# Patient Record
Sex: Male | Born: 1960 | Race: White | Hispanic: No | Marital: Single | Smoking: Former smoker
Health system: Southern US, Community
[De-identification: ages and names within clinical notes are randomized; demographics above are authoritative.]

## PROBLEM LIST (undated history)

## (undated) DIAGNOSIS — N200 Calculus of kidney: Secondary | ICD-10-CM

## (undated) DIAGNOSIS — Z87891 Personal history of nicotine dependence: Secondary | ICD-10-CM

## (undated) DIAGNOSIS — K219 Gastro-esophageal reflux disease without esophagitis: Secondary | ICD-10-CM

## (undated) DIAGNOSIS — N400 Enlarged prostate without lower urinary tract symptoms: Secondary | ICD-10-CM

## (undated) DIAGNOSIS — I5020 Unspecified systolic (congestive) heart failure: Secondary | ICD-10-CM

## (undated) DIAGNOSIS — R9431 Abnormal electrocardiogram [ECG] [EKG]: Secondary | ICD-10-CM

## (undated) DIAGNOSIS — Z87442 Personal history of urinary calculi: Secondary | ICD-10-CM

## (undated) DIAGNOSIS — I38 Endocarditis, valve unspecified: Secondary | ICD-10-CM

## (undated) DIAGNOSIS — I10 Essential (primary) hypertension: Secondary | ICD-10-CM

## (undated) DIAGNOSIS — F1290 Cannabis use, unspecified, uncomplicated: Secondary | ICD-10-CM

## (undated) DIAGNOSIS — I2510 Atherosclerotic heart disease of native coronary artery without angina pectoris: Secondary | ICD-10-CM

## (undated) DIAGNOSIS — I77810 Thoracic aortic ectasia: Secondary | ICD-10-CM

## (undated) DIAGNOSIS — A159 Respiratory tuberculosis unspecified: Secondary | ICD-10-CM

## (undated) DIAGNOSIS — E785 Hyperlipidemia, unspecified: Secondary | ICD-10-CM

## (undated) DIAGNOSIS — T8859XA Other complications of anesthesia, initial encounter: Secondary | ICD-10-CM

## (undated) DIAGNOSIS — K4090 Unilateral inguinal hernia, without obstruction or gangrene, not specified as recurrent: Secondary | ICD-10-CM

## (undated) HISTORY — DX: Unilateral inguinal hernia, without obstruction or gangrene, not specified as recurrent: K40.90

## (undated) HISTORY — DX: Endocarditis, valve unspecified: I38

## (undated) HISTORY — DX: Calculus of kidney: N20.0

## (undated) HISTORY — DX: Thoracic aortic ectasia: I77.810

## (undated) HISTORY — DX: Abnormal electrocardiogram (ECG) (EKG): R94.31

## (undated) HISTORY — DX: Unspecified systolic (congestive) heart failure: I50.20

## (undated) HISTORY — DX: Personal history of nicotine dependence: Z87.891

---

## 1985-07-18 HISTORY — PX: INGUINAL HERNIA REPAIR: SUR1180

## 2008-12-02 ENCOUNTER — Emergency Department: Payer: Self-pay | Admitting: Unknown Physician Specialty

## 2010-07-03 ENCOUNTER — Emergency Department (HOSPITAL_COMMUNITY)
Admission: EM | Admit: 2010-07-03 | Discharge: 2010-07-03 | Payer: Self-pay | Source: Home / Self Care | Admitting: Emergency Medicine

## 2010-11-09 ENCOUNTER — Encounter: Payer: Self-pay | Admitting: Family Medicine

## 2010-11-09 ENCOUNTER — Ambulatory Visit (INDEPENDENT_AMBULATORY_CARE_PROVIDER_SITE_OTHER): Payer: BC Managed Care – PPO | Admitting: Family Medicine

## 2010-11-09 VITALS — BP 138/80 | HR 80 | Temp 97.9°F | Ht 71.75 in | Wt 187.1 lb

## 2010-11-09 DIAGNOSIS — D179 Benign lipomatous neoplasm, unspecified: Secondary | ICD-10-CM | POA: Insufficient documentation

## 2010-11-09 DIAGNOSIS — F17200 Nicotine dependence, unspecified, uncomplicated: Secondary | ICD-10-CM

## 2010-11-09 DIAGNOSIS — M79609 Pain in unspecified limb: Secondary | ICD-10-CM

## 2010-11-09 DIAGNOSIS — Z720 Tobacco use: Secondary | ICD-10-CM

## 2010-11-09 DIAGNOSIS — Z0001 Encounter for general adult medical examination with abnormal findings: Secondary | ICD-10-CM | POA: Insufficient documentation

## 2010-11-09 DIAGNOSIS — N508 Other specified disorders of male genital organs: Secondary | ICD-10-CM

## 2010-11-09 DIAGNOSIS — M79601 Pain in right arm: Secondary | ICD-10-CM

## 2010-11-09 DIAGNOSIS — K4090 Unilateral inguinal hernia, without obstruction or gangrene, not specified as recurrent: Secondary | ICD-10-CM | POA: Insufficient documentation

## 2010-11-09 DIAGNOSIS — N448 Other noninflammatory disorders of the testis: Secondary | ICD-10-CM

## 2010-11-09 DIAGNOSIS — Z0000 Encounter for general adult medical examination without abnormal findings: Secondary | ICD-10-CM | POA: Insufficient documentation

## 2010-11-09 DIAGNOSIS — Z87891 Personal history of nicotine dependence: Secondary | ICD-10-CM | POA: Insufficient documentation

## 2010-11-09 MED ORDER — NAPROXEN 500 MG PO TABS: 500.0000 mg | ORAL_TABLET | Freq: Two times a day (BID) | ORAL | Status: DC

## 2010-11-09 NOTE — Progress Notes (Signed)
Subjective:    Patient ID: Caleb Spencer, male    DOB: 10/03/1960, 50 y.o.   MRN: [id number]  HPI CC: new patient  Never had doctor before.  1. Several month history of fingers and arm tingling.  Thumb and index finger. Wakes him up in middle of night.  Feels ache in elbow joint as well that wakes him up.  Has tried ibuprofen which didn't help.  No shoulder or neck pain.  Worse with extension of arm.  2. Smoking - 3-4 cigarettes/day with beer at night.  Wants to quit.  3. Also wants lump on shoulder checked.  There for 1-2 years.  Not painful.  6lb weight loss in 2 months.  Tends to lose weight in summer, put back on in winter.  No NS, f/c.  Preventative: Unsure last tetanus (10 years ago), requests today. No recent blood work.  Medications and allergies reviewed and updated as above. PMHx, SurgHx, SHx and FMHx reviewed for relevance and updated in chart.  Review of Systems  Constitutional: Negative for fever, chills, activity change, appetite change, fatigue and unexpected weight change.  HENT: Negative for hearing loss and neck pain.   Eyes: Negative for visual disturbance.  Respiratory: Positive for cough (smoker's cough) and shortness of breath. Negative for chest tightness and wheezing.   Cardiovascular: Negative for chest pain, palpitations and leg swelling.  Gastrointestinal: Negative for nausea, vomiting, abdominal pain, diarrhea, constipation, blood in stool and abdominal distention.  Genitourinary: Positive for scrotal swelling. Negative for hematuria and difficulty urinating.  Musculoskeletal: Negative for myalgias and arthralgias.  Skin: Negative for rash.  Neurological: Negative for dizziness, seizures, syncope and headaches.  Hematological: Does not bruise/bleed easily.  Psychiatric/Behavioral: Negative for dysphoric mood. The patient is not nervous/anxious.        Objective:   Physical Exam  [nursing notereviewed. Constitutional: He is oriented to person,  place, and time. He appears well-developed and well-nourished.  HENT:  Head: Normocephalic and atraumatic.  Right Ear: External ear normal.  Left Ear: External ear normal.  Nose: Nose normal.  Mouth/Throat: Oropharynx is clear and moist. No oropharyngeal exudate.  Eyes: Conjunctivae and EOM are normal. Pupils are equal, round, and reactive to light. No scleral icterus.  Neck: Normal range of motion. Neck supple. No thyromegaly present.  Cardiovascular: Normal rate, regular rhythm, normal heart sounds and intact distal pulses.   No murmur heard. Pulses:      Radial pulses are 2+ on the right side, and 2+ on the left side.  Pulmonary/Chest: Effort normal and breath sounds normal. No respiratory distress. He has no wheezes. He has no rales.  Abdominal: Soft. Bowel sounds are normal. He exhibits no distension and no mass. There is no tenderness. There is no rebound and no guarding. A hernia is present. Hernia confirmed positive in the right inguinal area (large hernia into scrotal sack, reduces with supine). Hernia confirmed negative in the left inguinal area.  Genitourinary: Penis normal. Right testis shows swelling (no hardening of testes, but there is some hypertrophy on right). Right testis shows no tenderness. Left testis shows no mass and no tenderness.  Musculoskeletal: Normal range of motion. He exhibits no edema.       Right elbow: He exhibits normal range of motion, no swelling and no effusion. tenderness found. Lateral epicondyle tenderness noted. No radial head, no medial epicondyle and no olecranon process tenderness noted.       Left elbow: Normal.       Neg phalen, tinel's.  Endorses numbness palmar 1st/2nd R digits, none on palm or wrist.  Grip strength intact bilaterally  Lymphadenopathy:    He has no cervical adenopathy.       Right: No inguinal adenopathy present.       Left: No inguinal adenopathy present.  Neurological: He is alert and oriented to person, place, and time.        CN grossly intact, station and gait intact  Skin: Skin is warm and dry. No rash noted.     Psychiatric: He has a normal mood and affect. His behavior is normal. Judgment and thought content normal.          Assessment & Plan:

## 2010-11-09 NOTE — Assessment & Plan Note (Signed)
Longstanding large R inguinal hernia, will need repair.  Referral to surgery.  No pain currently, no incarceration. S/p [facility] repair [phone]

## 2010-11-09 NOTE — Patient Instructions (Addendum)
For hernia - we will refer you to surgery for evaluation. Schedule scrotal ultrasound. Pass by Marion's office to set both of these up. Return at your convenience in next few months to schedule physical (fasting a few days prior for blood work). Try stretching exercises on handout for tennis elbow. Continue anti inflammatory - sent script to your pharmacy - take with food.  Stop ibuprofen (same family).

## 2010-11-09 NOTE — Assessment & Plan Note (Signed)
Reassured.  Not bothering him currently

## 2010-11-09 NOTE — Assessment & Plan Note (Signed)
Due for tetanus, blood work.  return when fasting for CPE.

## 2010-11-09 NOTE — Assessment & Plan Note (Signed)
L testicle WNL. Enlarged R testicle, nontender, not hardened per se.  Hernia reduces when supine, testis still with some hypertrophy.  ? Just from chronic pressure of hernia on testicle.  Will check scrotal US to r/o other cause.

## 2010-11-09 NOTE — Assessment & Plan Note (Signed)
Encouraged cessation - did not have time to discuss further

## 2010-11-09 NOTE — Progress Notes (Signed)
Addended by: Eustaquio Boyden on: 11/09/2010 12:44 PM   Modules accepted: Orders

## 2010-11-09 NOTE — Assessment & Plan Note (Signed)
Tender at lateral epicondyle, treat as tennis elbow with stretching exercises (handout from [facility] patient advisor) as well as NSAIDs.   Return if not improved.  Considered CTS however phalen's and tinels negative.  If not improved, consider referral to hand for eval.

## 2010-11-11 ENCOUNTER — Ambulatory Visit
Admission: RE | Admit: 2010-11-11 | Discharge: 2010-11-11 | Disposition: A | Discharge status: Home / Self Care | Payer: BC Managed Care – PPO | Source: Ambulatory Visit | Attending: Family Medicine | Admitting: Family Medicine

## 2010-11-11 DIAGNOSIS — N448 Other noninflammatory disorders of the testis: Secondary | ICD-10-CM

## 2010-12-19 ENCOUNTER — Other Ambulatory Visit: Payer: Self-pay | Admitting: Family Medicine

## 2010-12-19 DIAGNOSIS — Z13220 Encounter for screening for lipoid disorders: Secondary | ICD-10-CM

## 2010-12-19 DIAGNOSIS — Z0000 Encounter for general adult medical examination without abnormal findings: Secondary | ICD-10-CM

## 2010-12-20 ENCOUNTER — Other Ambulatory Visit: Payer: BC Managed Care – PPO

## 2010-12-28 ENCOUNTER — Encounter: Payer: BC Managed Care – PPO | Admitting: Family Medicine

## 2011-01-04 ENCOUNTER — Encounter: Payer: BC Managed Care – PPO | Admitting: Family Medicine

## 2011-02-07 ENCOUNTER — Other Ambulatory Visit (INDEPENDENT_AMBULATORY_CARE_PROVIDER_SITE_OTHER): Payer: BC Managed Care – PPO | Admitting: Family Medicine

## 2011-02-07 DIAGNOSIS — Z13220 Encounter for screening for lipoid disorders: Secondary | ICD-10-CM

## 2011-02-07 DIAGNOSIS — Z0000 Encounter for general adult medical examination without abnormal findings: Secondary | ICD-10-CM

## 2011-02-07 LAB — COMPREHENSIVE METABOLIC PANEL
ALT: 17 U/L (ref 0–53)
Albumin: 4.3 g/dL (ref 3.5–5.2)
CO2: 28 mEq/L (ref 19–32)
Calcium: 8.7 mg/dL (ref 8.4–10.5)
Chloride: 103 mEq/L (ref 96–112)
GFR: 76.28 mL/min (ref >60.00)
Potassium: 4.8 mEq/L (ref 3.5–5.1)
Sodium: 138 mEq/L (ref 135–145)
Total Protein: 7.9 g/dL (ref 6.0–8.3)

## 2011-02-07 LAB — CBC WITH DIFFERENTIAL/PLATELET
Basophils Absolute: 0 10*3/uL (ref 0.0–0.1)
Eosinophils Absolute: 0.1 10*3/uL (ref 0.0–0.7)
Lymphocytes Relative: 25.4 % (ref 12.0–46.0)
MCHC: 33.9 g/dL (ref 30.0–36.0)
MCV: 98.9 fl (ref 78.0–100.0)
Monocytes Absolute: 0.9 10*3/uL (ref 0.1–1.0)
Neutrophils Relative %: 63.7 % (ref 43.0–77.0)
RDW: 13.4 % (ref 11.5–14.6)

## 2011-02-07 LAB — LIPID PANEL
Cholesterol: 208 mg/dL — ABNORMAL HIGH (ref 0–200)
Total CHOL/HDL Ratio: 4

## 2011-02-10 ENCOUNTER — Ambulatory Visit (INDEPENDENT_AMBULATORY_CARE_PROVIDER_SITE_OTHER): Payer: BC Managed Care – PPO | Admitting: Family Medicine

## 2011-02-10 ENCOUNTER — Encounter: Payer: Self-pay | Admitting: Family Medicine

## 2011-02-10 VITALS — BP 138/80 | HR 68 | Temp 98.6°F | Wt 186.8 lb

## 2011-02-10 DIAGNOSIS — Z720 Tobacco use: Secondary | ICD-10-CM

## 2011-02-10 DIAGNOSIS — F17200 Nicotine dependence, unspecified, uncomplicated: Secondary | ICD-10-CM

## 2011-02-10 DIAGNOSIS — K4090 Unilateral inguinal hernia, without obstruction or gangrene, not specified as recurrent: Secondary | ICD-10-CM

## 2011-02-10 DIAGNOSIS — Z0000 Encounter for general adult medical examination without abnormal findings: Secondary | ICD-10-CM

## 2011-02-10 DIAGNOSIS — Z23 Encounter for immunization: Secondary | ICD-10-CM

## 2011-02-10 NOTE — Progress Notes (Signed)
Subjective:    Patient ID: Caleb Spencer, male    DOB: 1961-03-30, 50 y.o.   MRN: [id number]  HPI CC: CPE  Arm tingling - continued, trouble sleeping at night 2/2 this.  Ibuprofen/naprosyn didn't help.  Naprosyn caused gi upset.  OJ upsets stomach, h/o reflux in past.  Works outside, drinks plenty of water to stay hydrated.  Elevated sugar - did drink 7-up prior to visit.  Attributes to this.  EtOH - drinks a forty daily.  Cigarettes - down to 1 cig/day.  Wants to quit cold [place]  Activity - stays active at work. Diet - endorses healthy, plenty of fruits and vegetables  Preventative: Unsure last tetanus (10 years ago). Requests today. reviewed blood work. Not due for colon or prostate screening.  No bowel changes, no blood in stool, voiding fine.  Medications and allergies reviewed and updated in chart. Patient Active Problem List  Diagnoses  . Tobacco abuse  . Right inguinal hernia  . Hypertrophy, testis  . Lipoma  . Healthcare maintenance  . Arm pain, right   Past Medical History  Diagnosis Date  . Kidney stone   . Tobacco abuse   . Left groin hernia     working on finances to be able to do   Past Surgical History  Procedure Date  . Inguinal hernia repair 1987    Left   History  Substance Use Topics  . Smoking status: Current Everyday Smoker    Types: Cigarettes  . Smokeless tobacco: Never Used   Comment: 2-3 cigarettes/daily  . Alcohol Use: Yes     6 pack/weekly-possibly a little more   Family History  Problem Relation Age of Onset  . Diabetes Mother   . Alcohol abuse Father   . Coronary artery disease Father   . Lung cancer Maternal Aunt   . Stroke Neg Hx    No Known Allergies Current Outpatient Prescriptions on File Prior to Visit  Medication Sig Dispense Refill  . Multiple Vitamin (MULTIVITAMIN PO) Take 1 tablet by mouth daily.         Review of Systems  Constitutional: Negative for fever, chills, activity change, appetite change, fatigue  and unexpected weight change.  HENT: Negative for hearing loss and neck pain.   Eyes: Negative for visual disturbance.  Respiratory: Negative for cough, chest tightness, shortness of breath and wheezing.   Cardiovascular: Negative for chest pain, palpitations and leg swelling.  Gastrointestinal: Negative for nausea, vomiting, abdominal pain, diarrhea, constipation, blood in stool and abdominal distention.  Genitourinary: Negative for hematuria and difficulty urinating.  Musculoskeletal: Negative for myalgias and arthralgias.  Skin: Negative for rash.  Neurological: Negative for dizziness, seizures, syncope and headaches.  Hematological: Does not bruise/bleed easily.  Psychiatric/Behavioral: Negative for dysphoric mood. The patient is not nervous/anxious.        Objective:   Physical Exam  Nursing note and vitals reviewed. Constitutional: He is oriented to person, place, and time. He appears well-developed and well-nourished. No distress.  HENT:  Head: Normocephalic and atraumatic.  Right Ear: External ear normal.  Left Ear: External ear normal.  Nose: Nose normal.  Mouth/Throat: Oropharynx is clear and moist.  Eyes: Conjunctivae and EOM are normal. Pupils are equal, round, and reactive to light.  Neck: Normal range of motion. Neck supple. No thyromegaly present.  Cardiovascular: Normal rate, regular rhythm, normal heart sounds and intact distal pulses.   No murmur heard. Pulses:      Radial pulses are 2+ on the right  side, and 2+ on the left side.  Pulmonary/Chest: Effort normal and breath sounds normal. No respiratory distress. He has no wheezes. He has no rales.  Abdominal: Soft. Bowel sounds are normal. He exhibits no distension and no mass. There is no tenderness. There is no rebound and no guarding.  Musculoskeletal: Normal range of motion.  Lymphadenopathy:    He has no cervical adenopathy.  Neurological: He is alert and oriented to person, place, and time.       CN grossly  intact, station and gait intact  Skin: Skin is warm and dry. No rash noted.  Psychiatric: He has a normal mood and affect. His behavior is normal. Judgment and thought content normal.          Assessment & Plan:

## 2011-02-10 NOTE — Patient Instructions (Addendum)
Tdap today. Copy of blood work provided. Good job with cutting back at smoking!  Look at [email or web address] for further resources. Good to see you today.  Call us if questions.

## 2011-02-11 NOTE — Assessment & Plan Note (Signed)
Saving money for down payment for hernia repair surgery.   Saw dr. Luisa Hart, liked him.

## 2011-02-11 NOTE — Assessment & Plan Note (Signed)
Close to quitting Encouraged cessation.

## 2011-02-11 NOTE — Assessment & Plan Note (Addendum)
Reviewed blood work in detail. Discussed healthy eating and lifestyle. Tdap today. Not due for colon/prostate discussion. No sxs. Had sugared soda prior to blood draw, likely normal. Encouraged to limit EtOH.

## 2011-02-15 ENCOUNTER — Encounter: Payer: Self-pay | Admitting: Family Medicine

## 2011-02-15 ENCOUNTER — Ambulatory Visit (INDEPENDENT_AMBULATORY_CARE_PROVIDER_SITE_OTHER): Payer: BC Managed Care – PPO | Admitting: Family Medicine

## 2011-02-15 VITALS — BP 130/92 | HR 76 | Temp 97.1°F | Ht 71.75 in | Wt 192.2 lb

## 2011-02-15 DIAGNOSIS — R21 Rash and other nonspecific skin eruption: Secondary | ICD-10-CM

## 2011-02-15 MED ORDER — MOMETASONE FUROATE 0.1 % EX CREA: TOPICAL_CREAM | CUTANEOUS | Status: AC

## 2011-02-15 MED ORDER — PREDNISONE 10 MG PO TABS: ORAL_TABLET | ORAL | Status: DC

## 2011-02-15 NOTE — Assessment & Plan Note (Signed)
Mainly on lower legs- slt on arms Insect bites from yard or poison ivy are possible Overall not in distribution of scabies tx with pred taper/ zyrtec and elocon cream Keep cool and clean Update if not imp

## 2011-02-15 NOTE — Progress Notes (Signed)
Subjective:    Patient ID: Caleb Spencer, male    DOB: [birth year], 50 y.o.   MRN: [id number]  HPI Has been itching for a couple of weeks  Thought it was chiggers at first   A friend had rash for 12 months - there was a question of scabies  ? Likely something else however as he did not improve with tx   It is itchy- is up on upper legs and few spots on arms  Ankles and legs - worse when hot  None on hands and feet   Has been in poison ivy - thinks it could have been that - is allergic  No otc meds except rubbing alcohol   No pets in house with fleas   Patient Active Problem List  Diagnoses  . Tobacco abuse  . Right inguinal hernia  . Lipoma  . Healthcare maintenance  . Arm pain, right  . Rash, skin   Past Medical History  Diagnosis Date  . Kidney stone   . Tobacco abuse   . Left groin hernia     working on finances to be able to do   Past Surgical History  Procedure Date  . Inguinal hernia repair 1987    Left   History  Substance Use Topics  . Smoking status: Current Everyday Smoker    Types: Cigarettes  . Smokeless tobacco: Never Used   Comment: 2-3 cigarettes/daily  . Alcohol Use: Yes     6 pack/weekly-possibly a little more   Family History  Problem Relation Age of Onset  . Diabetes Mother   . Alcohol abuse Father   . Coronary artery disease Father   . Lung cancer Maternal Aunt   . Stroke Neg Hx    No Known Allergies Current Outpatient Prescriptions on File Prior to Visit  Medication Sig Dispense Refill  . Multiple Vitamin (MULTIVITAMIN PO) Take 1 tablet by mouth daily.              Review of Systems Review of Systems  Constitutional: Negative for fever, appetite change, fatigue and unexpected weight change.  Eyes: Negative for pain and visual disturbance.  Respiratory: Negative for cough and shortness of breath.   Cardiovascular: Negative.  for cp Gastrointestinal: Negative for nausea, diarrhea and constipation.  Genitourinary: Negative for  urgency and frequency.  Skin: Negative for pallor. pos for rash that is intensely itchy Neurological: Negative for weakness, light-headedness, numbness and headaches.  Hematological: Negative for adenopathy. Does not bruise/bleed easily.  Psychiatric/Behavioral: Negative for dysphoric mood. The patient is not nervous/anxious.         Objective:   Physical Exam  Constitutional: He appears well-developed and well-nourished. No distress.       Uncomfortable from itching   HENT:  Head: Normocephalic and atraumatic.  Mouth/Throat: Oropharynx is clear and moist.  Eyes: Conjunctivae and EOM are normal. Pupils are equal, round, and reactive to light. Right eye exhibits no discharge. Left eye exhibits no discharge.  Neck: Normal range of motion. Neck supple. No tracheal deviation present. No thyromegaly present.  Cardiovascular: Normal rate, regular rhythm, normal heart sounds and intact distal pulses.   Pulmonary/Chest: Effort normal and breath sounds normal. No respiratory distress. He has no wheezes.       Diffusely distant bs   Abdominal: Soft. Bowel sounds are normal. There is no tenderness.  Musculoskeletal: He exhibits no edema.  Lymphadenopathy:    He has no cervical adenopathy.  Neurological: He is alert. He has normal reflexes.  Skin: Skin is warm and dry. No pallor.       Diffuse rash on lower ext- ankles to thighs Few lesions on arms Spares palms / web spaces/ feet and other areas  Some vesicles and papules No s/s of bact infx  Psychiatric: He has a normal mood and affect.          Assessment & Plan:   No problem-specific assessment & plan notes found for this encounter.

## 2011-02-15 NOTE — Patient Instructions (Signed)
Take zyrtec 10 mg daily over the counter - to help itch  Take predisone taper as follows  3 pills once daily for 3 days then 2 pills once daily for 3 days then 1 pill once daily for 3 days  This will make you hyper and hungry  Also use elocon cream on affected areas once per day  Keep cool / clean with antibacterial soap and water  Update me if not improving in 1 week

## 2012-08-03 ENCOUNTER — Other Ambulatory Visit: Payer: Self-pay | Admitting: *Deleted

## 2012-08-03 ENCOUNTER — Encounter: Payer: Self-pay | Admitting: Family Medicine

## 2012-08-03 ENCOUNTER — Ambulatory Visit (INDEPENDENT_AMBULATORY_CARE_PROVIDER_SITE_OTHER)
Admission: RE | Admit: 2012-08-03 | Discharge: 2012-08-03 | Disposition: A | Discharge status: Home / Self Care | Payer: BC Managed Care – PPO | Source: Ambulatory Visit | Attending: Family Medicine | Admitting: Family Medicine

## 2012-08-03 ENCOUNTER — Ambulatory Visit (INDEPENDENT_AMBULATORY_CARE_PROVIDER_SITE_OTHER): Payer: BC Managed Care – PPO | Admitting: Family Medicine

## 2012-08-03 VITALS — BP 132/90 | HR 72 | Temp 98.0°F | Wt 206.0 lb

## 2012-08-03 DIAGNOSIS — Z87891 Personal history of nicotine dependence: Secondary | ICD-10-CM

## 2012-08-03 DIAGNOSIS — D179 Benign lipomatous neoplasm, unspecified: Secondary | ICD-10-CM

## 2012-08-03 DIAGNOSIS — M25512 Pain in left shoulder: Secondary | ICD-10-CM | POA: Insufficient documentation

## 2012-08-03 DIAGNOSIS — M25519 Pain in unspecified shoulder: Secondary | ICD-10-CM

## 2012-08-03 MED ORDER — NAPROXEN 500 MG PO TABS: ORAL_TABLET | ORAL | Status: DC

## 2012-08-03 NOTE — Assessment & Plan Note (Signed)
Check L shoulder films given chronicity of sxs - clear on my read, preserved joint spaces. Anticipate has developed chronic rotator cuff tendinopathy. Treat with short term naprosyn, ice/heat, and physical therapy. stretching exercises provided along with resistance band. If not better with this, will refer to ortho for further evaluation/treatment.

## 2012-08-03 NOTE — Patient Instructions (Signed)
Shoulder xray today. I think you have chronic rotator cuff injury - treat with naprosyn twice daily for 3-5 days then as needed (take with food to avoid stomach upset). If xray looking ok - I will refer you for physical therapy for shoulder. If not better with this, I recommend evaluation by orthopedist.  For mass on left shoulder blade - pass by Marion's office for referral to surgery.
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
## 2012-08-03 NOTE — Assessment & Plan Note (Signed)
Per pt enlarging, tender to palpation. Desires definitive therapy - will refer to surgery.

## 2012-08-03 NOTE — Assessment & Plan Note (Signed)
Congratulated on quitting- encouraged to continue abstinence.

## 2012-10-08 ENCOUNTER — Encounter: Payer: Self-pay | Admitting: *Deleted

## 2012-10-11 ENCOUNTER — Ambulatory Visit: Payer: Self-pay | Admitting: General Surgery

## 2012-11-21 ENCOUNTER — Encounter: Payer: Self-pay | Admitting: *Deleted

## 2015-04-27 ENCOUNTER — Ambulatory Visit (INDEPENDENT_AMBULATORY_CARE_PROVIDER_SITE_OTHER): Payer: BLUE CROSS/BLUE SHIELD | Admitting: Family Medicine

## 2015-04-27 ENCOUNTER — Encounter: Payer: Self-pay | Admitting: Family Medicine

## 2015-04-27 ENCOUNTER — Ambulatory Visit (INDEPENDENT_AMBULATORY_CARE_PROVIDER_SITE_OTHER)
Admission: RE | Admit: 2015-04-27 | Discharge: 2015-04-27 | Disposition: A | Discharge status: Home / Self Care | Payer: BLUE CROSS/BLUE SHIELD | Source: Ambulatory Visit | Attending: Family Medicine | Admitting: Family Medicine

## 2015-04-27 VITALS — BP 138/88 | HR 69 | Temp 98.1°F | Ht 71.75 in | Wt 203.5 lb

## 2015-04-27 DIAGNOSIS — M25562 Pain in left knee: Secondary | ICD-10-CM | POA: Diagnosis not present

## 2015-04-27 MED ORDER — MELOXICAM 15 MG PO TABS: 15.0000 mg | ORAL_TABLET | Freq: Every day | ORAL | Status: DC | PRN

## 2015-04-27 NOTE — Assessment & Plan Note (Signed)
Acute on chronic after hearing a "pop" while stepping off a ladder Effusion on exam  Knee immobilizer and crutches given  xr now  meloxicam 15 mg daily instead of acetaminophen Suspect meniscal injury

## 2015-04-27 NOTE — Progress Notes (Signed)
Subjective:    Patient ID: Caleb Spencer, male    DOB: Feb 06, 1961, 54 y.o.   MRN: [id number]  HPI Here with L knee pain   This is acute on chronic  Never knew why it hurt before   Arthritis runs in family and has it in shoulder   Slipped coming down off a ladder -last Monday afternoon  Came down hard on the leg and he turned his body and he heard it "pop"  Works [employer]- has to squat and move a lot   Worked tues and could not work for the rest of the week  Elevated knee and used ice after that  [employer] acetaminophen- he thinks - 3 at a time     Sore on the sides - worse medially  A little swollen  No bruising  Hurts wort to bend it and also fully extend it   Patient Active Problem List   Diagnosis Date Noted  . Left shoulder pain 08/03/2012  . Rash, skin 02/15/2011  . Right inguinal hernia 11/09/2010  . Lipoma 11/09/2010  . Healthcare maintenance 11/09/2010  . Arm pain, right 11/09/2010  . History of tobacco abuse    Past Medical History  Diagnosis Date  . Kidney stone   . History of smoking   . Left groin hernia     working on finances to be able to do   Past Surgical History  Procedure Laterality Date  . Inguinal hernia repair  1987    Left   Social History  Substance Use Topics  . Smoking status: Former Smoker    Types: Cigarettes  . Smokeless tobacco: Never Used  . Alcohol Use: 0.0 oz/week    0 Standard drinks or equivalent per week     Comment: 6 pack/weekly-possibly a little more   Family History  Problem Relation Age of Onset  . Diabetes Mother   . Alcohol abuse Father   . Coronary artery disease Father   . Lung cancer Maternal Aunt   . Stroke Neg Hx    No Known Allergies Current Outpatient Prescriptions on File Prior to Visit  Medication Sig Dispense Refill  . Multiple Vitamin (MULTIVITAMIN PO) Take 1 tablet by mouth daily.       No current facility-administered medications on file prior to visit.     Review of Systems Review  of Systems  Constitutional: Negative for fever, appetite change, fatigue and unexpected weight change.  Eyes: Negative for pain and visual disturbance.  Respiratory: Negative for cough and shortness of breath.   Cardiovascular: Negative for cp or palpitations    Gastrointestinal: Negative for nausea, diarrhea and constipation.  Genitourinary: Negative for urgency and frequency.  Skin: Negative for pallor or rash   MSK pos for L knee pain and swelling  Neurological: Negative for weakness, light-headedness, numbness and headaches.  Hematological: Negative for adenopathy. Does not bruise/bleed easily.  Psychiatric/Behavioral: Negative for dysphoric mood. The patient is not nervous/anxious.         Objective:   Physical Exam  Constitutional: He appears well-developed and well-nourished. No distress.  HENT:  Head: Normocephalic and atraumatic.  Cardiovascular: Normal rate and regular rhythm.   Musculoskeletal: He exhibits edema and tenderness.       Left knee: He exhibits decreased range of motion, swelling, effusion and abnormal meniscus. He exhibits no ecchymosis, no deformity, normal alignment, no LCL laxity, normal patellar mobility and no MCL laxity. Tenderness found. Medial joint line, lateral joint line and patellar tendon  tenderness noted.  L knee-tender med/lat joint line Effusion noted medially  Flex - to 90 deg with pain  Cannot fully extend   Stable on drawer/lachman   Can bear partial weight     Neurological: He is alert. He has normal strength and normal reflexes. He displays no atrophy. No sensory deficit.  Skin: Skin is warm and dry. No rash noted. No erythema. No pallor.  Psychiatric: He has a normal mood and affect.          Assessment & Plan:   Problem List Items Addressed This Visit      Other   Left knee pain - Primary    Acute on chronic after hearing a "pop" while stepping off a ladder Effusion on exam  Knee immobilizer and crutches given  xr now    meloxicam 15 mg daily instead of acetaminophen Suspect meniscal injury      Relevant Orders   DG Knee 4 Views W/Patella Left (Completed)

## 2015-04-27 NOTE — Patient Instructions (Signed)
Xray now  Get the meloxicam 15 mg once daily with food  Do not take any more acetaminophen  Elevate knee and use cold compress/ice - for 10 minutes on and off

## 2015-04-27 NOTE — Progress Notes (Signed)
Pre visit review using our clinic review tool, if applicable. No additional management support is needed unless otherwise documented below in the visit note. 

## 2015-04-28 ENCOUNTER — Encounter: Payer: Self-pay | Admitting: *Deleted

## 2015-05-04 ENCOUNTER — Ambulatory Visit (INDEPENDENT_AMBULATORY_CARE_PROVIDER_SITE_OTHER): Payer: BLUE CROSS/BLUE SHIELD | Admitting: Family Medicine

## 2015-05-04 ENCOUNTER — Encounter: Payer: Self-pay | Admitting: Family Medicine

## 2015-05-04 VITALS — BP 140/84 | HR 57 | Temp 98.3°F | Ht 71.75 in | Wt 205.8 lb

## 2015-05-04 DIAGNOSIS — M25562 Pain in left knee: Secondary | ICD-10-CM | POA: Diagnosis not present

## 2015-05-04 DIAGNOSIS — M2392 Unspecified internal derangement of left knee: Secondary | ICD-10-CM

## 2015-05-04 NOTE — Progress Notes (Signed)
Pre visit review using our clinic review tool, if applicable. No additional management support is needed unless otherwise documented below in the visit note. 

## 2015-05-04 NOTE — Progress Notes (Signed)
Dr. Frederico Hamman T. Xee Hollman, MD, [facility] [place], [address] Phone: [phone] Fax: [phone]  05/04/2015  Patient: Caleb Spencer, MRN: [id number], DOB: Nov 06, 1960, 54 y.o.  Primary Physician:  Ria Bush, MD   Chief Complaint  Patient presents with  . Knee Pain    Left   Subjective:   This 54 y.o. male patient presents with 10 day h/o L sided knee pain after stepping down from a ladder. + audible pop was heard. The patient has not had a significant effusion. No symptomatic giving-way. No mechanical clicking. Joint has not locked up. Patient has been able to walk but is limping. The patient does not have pain going up and down stairs or rising from a seated position.   Left knee - intermittently  Stepped off the ladder on the last bit and felt a pop and had some pain. Last Sunday, stepped off the porch and had a loud pop.   Pain location:all over and medial Current physical activity: physical labor for work Prior Knee Surgery: none Current pain meds: mobic Bracing: patellar j brace Occupation or school level: waterproofing  The PM[facility], [place], Social History, Family History, Medications, and allergies have been reviewed in [facility], and have been updated if relevant.  Patient Active Problem List   Diagnosis Date Noted  . Left knee pain 04/27/2015  . Left shoulder pain 08/03/2012  . Rash, skin 02/15/2011  . Right inguinal hernia 11/09/2010  . Lipoma 11/09/2010  . Healthcare maintenance 11/09/2010  . Arm pain, right 11/09/2010  . History of tobacco abuse     Past Medical History  Diagnosis Date  . Kidney stone   . History of smoking   . Left groin hernia     working on finances to be able to do    Past Surgical History  Procedure Laterality Date  . Inguinal hernia repair  1987    Left    Social History   Social History  . Marital Status: Single    Spouse Name: N/A  . Number of Children: N/A  .  Years of Education: 10th   Occupational History  . fireproofing and waterproofing    Social History Main Topics  . Smoking status: Former Smoker    Types: Cigarettes  . Smokeless tobacco: Never Used  . Alcohol Use: 0.0 oz/week    0 Standard drinks or equivalent per week     Comment: 6 pack/weekly-possibly a little more  . Drug Use: No  . Sexual Activity: Not on file   Other Topics Concern  . Not on file   Social History Narrative   Caffeine: 1/2 pot coffee   Lives with mother, no pets    Family History  Problem Relation Age of Onset  . Diabetes Mother   . Alcohol abuse Father   . Coronary artery disease Father   . Lung cancer Maternal Aunt   . Stroke Neg Hx     No Known Allergies  Medication list reviewed and updated in full in [place].  ROS: no acute illness or fever MSK: above GI: tol po intake without nausea or vomitting. Neuro: no numbness, tingling, or radiculopathy O/w per hpi  Objective:   Blood pressure 140/84, pulse 57, temperature 98.3 F (36.8 C), temperature source Oral, height 5' 11.75" (1.822 m), weight 205 lb 12 oz (93.328 kg).  GEN: Well-developed,well-nourished,in no acute distress; alert,appropriate and cooperative throughout examination HEENT: Normocephalic and atraumatic without  obvious abnormalities. Ears, externally no deformities PULM: Breathing comfortably in no respiratory distress EXT: No clubbing, cyanosis, or edema PSYCH: Normally interactive. Cooperative during the interview. Pleasant. Friendly and conversant. Not anxious or depressed appearing. Normal, full affect.  Gait: Normal heel toe pattern ROM: WNL Effusion: mod Echymosis or edema: none Patellar tendon NT Painful PLICA: neg Patellar grind: negative Medial and lateral patellar facet loading: negative medial and lateral joint lines: medial pain Mcmurray's neg Flexion-pinch pos Varus and valgus stress: stable Lachman: neg Ant and Post drawer: neg Hip  abduction, IR, ER: WNL Hip flexion str: 5/5 Hip abd: 5/5 Quad: 5/5 VMO atrophy:No Hamstring concentric and eccentric: 5/5  Radiology: Dg Knee 4 Views W/patella Left  04/27/2015  CLINICAL DATA:  Acute on chronic knee pain after stepping off ladder. Initial encounter. EXAM: LEFT KNEE - COMPLETE 4+ VIEW COMPARISON:  None. FINDINGS: There is no evidence of fracture or dislocation. Moderate suprapatellar joint effusion is noted. There is no evidence of arthropathy or other focal bone abnormality. Soft tissues are unremarkable. IMPRESSION: Moderate suprapatellar joint effusion. No fracture or dislocation is noted. Electronically Signed   By: Marijo Conception, M.D.   On: 04/27/2015 15:32    Assessment and Plan:   Knee internal derangement, left  Left knee pain  >25 minutes spent in face to face time with patient, >50% spent in counselling or coordination of care  Trauma series independently reviewed. There is no evidence for fracture or dislocation. There is an effusion present.  More likely internal derangement of the knee, most likely given history meniscal pathology. The patient would like to be conservative and avoid surgery and avoid financial burden. He is improved quite a bit in the last week after being on NSAIDs. Continue with NSAIDs, activity modification, eyes, and bracing of his knee. He understands all of this and he is going to call me if his symptoms recur and he has swelling and pain lasting more than the following month.  Follow-up: No Follow-up on file.  Signed,  Maud Deed. Lalania Haseman, MD   Patient's Medications  New Prescriptions   No medications on file  Previous Medications   MELOXICAM (MOBIC) 15 MG TABLET    Take 1 tablet (15 mg total) by mouth daily as needed for pain. With food   MULTIPLE VITAMIN (MULTIVITAMIN PO)    Take 1 tablet by mouth daily.    Modified Medications   No medications on file  Discontinued Medications   No medications on file

## 2015-07-29 ENCOUNTER — Telehealth: Payer: Self-pay | Admitting: Family Medicine

## 2015-07-29 NOTE — Telephone Encounter (Signed)
[facility]  Patient Name: Caleb Spencer  DOB: [birth year]    Initial Comment Caller states he needs a refill on his mobic for his knee. He is still having pain in his knee.   Nurse Assessment  Nurse: Harlow Mares, RN, Suanne Marker Date/Time (Eastern Time): 07/29/2015 5:19:17 PM  Please select the assessment type ---Refill  Additional Documentation ---Caller states he needs a refill on his mobic for his knee. He is still having pain in his knee. Reports Mobic was given for 2 months and he feels that this has helped his knee and he feels like he needs another refill. He stands on a lot of concrete.  Does the patient have enough medication to last until the office opens? ---Unable to obtain loaner dose from Pharmacy  Does the client directives allow for assistance with medications after hours? ---Yes  Was the medication filled within the last 6 months? ---Yes  What is the name of the medication, dose and instructions as listed on the bottle? ---Mobic 7.5mg  daily  Name of the physician as listed on the bottle. ---Dr. Glori Bickers  Pharmacy name and phone number where most recently filled. ---[facility])  Additional Documentation ---Caller reports that he is out of town and will be back home tomorrow if he could get 1 more refill. No new or worsening symptoms, symptoms have improved but he feels like 1 more refill of Mobic would benefit him. Advised caller that nurse will place this note in his file to check with his pharmacy tomorrow, caller voiced understanding.

## 2015-07-29 NOTE — Telephone Encounter (Signed)
[facility]     Patient Name: Caleb Spencer Initial Comment Caller states he needs a refill on his mobic for his knee. He is still having pain in his knee.  DOB: 1961/05/08      Nurse Assessment  Nurse: Harlow Mares, RN, Suanne Marker Date/Time (Eastern Time): 07/29/2015 5:19:17 PM  Please select the assessment type ---Refill  Additional Documentation ---Caller states he needs a refill on his mobic for his knee. He is still having pain in his knee. Reports Mobic was given for 2 months and he feels that this has helped his knee and he feels like he needs another refill. He stands on a lot of concrete.  Does the patient have enough medication to last until the office opens? ---Unable to obtain loaner dose from Pharmacy  Does the client directives allow for assistance with medications after hours? ---Yes  Was the medication filled within the last 6 months? ---Yes  What is the name of the medication, dose and instructions as listed on the bottle? ---Mobic 7.5mg  daily  Name of the physician as listed on the bottle. ---Dr. Glori Bickers  Pharmacy name and phone number where most recently filled. ---[facility])  Additional Documentation ---Caller reports that he is out of town and will be back home tomorrow if he could get 1 more refill. No new or worsening symptoms, symptoms have improved but he feels like 1 more refill of Mobic would benefit him. Advised caller that nurse will place this note in his file to check with his pharmacy tomorrow, caller voiced understanding.    Nurse: Venetia Maxon, RN, Manuela Schwartz Date/Time (Eastern Time): 07/29/2015 5:31:58 PM  Confirm and document reason for call. If symptomatic, describe symptoms. ---Caller states he needs a refill on his mobic for his knee. He is still having pain in his knee. He was given 2 refills in October 2016. His left knee is the problem the med is working. He has had fluid and torn  cartilage.   Has the patient traveled out of the country within the last 30 days? ---No   Does the patient have any new or worsening symptoms? ---No   Please document clinical information provided and list any resource used. ---RN will try to reach the office to assist the pt Caller verbalized understanding     Guidelines     Guideline Title Affirmed Question Affirmed Notes        Final Disposition User   Clinical Call Venetia Maxon, RN, Manuela Schwartz

## 2015-07-30 MED ORDER — MELOXICAM 15 MG PO TABS: 15.0000 mg | ORAL_TABLET | Freq: Every day | ORAL | Status: DC | PRN

## 2015-07-30 NOTE — Telephone Encounter (Signed)
Refilled #30 

## 2015-07-30 NOTE — Telephone Encounter (Signed)
Please see separate team health note; appears refill done by [facility].

## 2015-07-30 NOTE — Telephone Encounter (Signed)
PLEASE NOTE: All timestamps contained within this report are represented as [place] Standard Time. CONFIDENTIALTY NOTICE: This fax transmission is intended only for the addressee. It contains information that is legally privileged, confidential or otherwise protected from use or disclosure. If you are not the intended recipient, you are strictly prohibited from reviewing, disclosing, copying using or disseminating any of this information or taking any action in reliance on or regarding this information. If you have received this fax in error, please notify us immediately by telephone so that we can arrange for its return to [place]. Phone: [phone], Toll-Free: [phone], Fax: [phone] Page: 1 of 3 Call Id: [id number] [place] Patient Name: Caleb Spencer Gender: Male DOB: 03-17-1961 Age: 55 Y 25 M Return Phone Number: [phone] (Primary) Address: City/State/Zip: Real Client [place] [facility] - Client Client Site [place] - [facility] [place], [place] Contact Type Call Call Type Triage / Clinical Relationship To Patient Self Appointment Disposition EMR Appointment Not Necessary Info pasted into Epic Yes Return Phone Number [phone] (Primary) Chief Complaint Leg Pain Initial Comment Caller states he needs a refill on his mobic for his knee. He is still having pain in his knee. Nurse Assessment Nurse: Harlow Mares, RN, Suanne Marker Date/Time (Eastern Time): 07/29/2015 5:19:17 PM Please select the assessment type ---Refill Additional Documentation ---Caller states he needs a refill on his mobic for his knee. He is still having pain in his knee. Reports Mobic was given for 2 months and he feels that this has helped his knee and he feels like he needs another refill. He stands on a lot of concrete. Does the patient have enough medication to last until the  office opens? ---Unable to obtain loaner dose from Pharmacy Does the client directives allow for assistance with medications after hours? ---Yes Was the medication filled within the last 6 months? ---Yes What is the name of the medication, dose and instructions as listed on the bottle? ---Mobic 7.5mg  daily Name of the physician as listed on the bottle. ---Dr. Glori Bickers Pharmacy name and phone number where most recently filled. ---[facility]) Additional Documentation ---Caller reports that he is out of town and will be back home tomorrow if he could get 1 more refill. No new or worsening symptoms, symptoms have improved but he feels like 1 more refill of Mobic would benefit him. Advised caller that nurse will place this note in his file to check with his pharmacy tomorrow, caller voiced understanding. Nurse: Venetia Maxon, RN, Manuela Schwartz Date/Time (Eastern Time): 07/29/2015 5:31:58 PM Confirm and document reason for call. If symptomatic, describe symptoms. ---Caller states he needs a refill on his mobic for his knee. He is still having pain in his knee. He was given 2 refills in October 2016. His left knee is the problem the med is working. He has had fluid and torn cartilage. Has the patient traveled out of the country within the last 30 days? ---No PLEASE NOTE: All timestamps contained within this report are represented as [place] Standard Time. CONFIDENTIALTY NOTICE: This fax transmission is intended only for the addressee. It contains information that is legally privileged, confidential or otherwise protected from use or disclosure. If you are not the intended recipient, you are strictly prohibited from reviewing, disclosing, copying using or disseminating any of this information or taking any action in reliance on or regarding this information. If you have received this fax in error, please  notify us immediately by telephone so that we can arrange for its return to [place]. Phone:  [phone], Toll-Free: [phone], Fax: [phone] Page: 2 of 3 Call Id: [id number] Nurse Assessment Does the patient have any new or worsening symptoms? ---No Please document clinical information provided and list any resource used. ---RN will try to reach the office to assist the pt Caller verbalized understanding Guidelines Guideline Title Affirmed Question Affirmed Notes Nurse Date/Time (Esmeralda Time) Disp. Time Eilene Ghazi Time) Disposition Final User 07/29/2015 5:40:41 PM Called On-Call Provider Venetia Maxon, RN, Manuela Schwartz 07/29/2015 5:51:32 PM Clinical Call Yes Venetia Maxon, RN, Manuela Schwartz After Care Instructions Given Call Event Type User Date / Time Description Verbal Orders/Maintenance Medications Medication Refill Route Dosage Regime Duration Admin Instructions User Name Meloxicam 15 mg one PO daily Oral Days Dispense # 30 no refills Venetia Maxon, RN, Jerline Pain [facility] Phone DateTime Result/Outcome Message Type Notes Walker Kehr [id number] 07/29/2015 5:40:41 PM Called On Call Provider - Reached Doctor Paged Walker Kehr 07/29/2015 5:44:59 PM Spoke with On Call - General Message Result recd refill order for Mobic ( see verbal order) from oncall MD advised caller and he verbalized understanding . PLEASE NOTE: All timestamps contained within this report are represented as [place] Standard Time. CONFIDENTIALTY NOTICE: This fax transmission is intended only for the addressee. It contains information that is legally privileged, confidential or otherwise protected from use or disclosure. If you are not the intended recipient, you are strictly prohibited from reviewing, disclosing, copying using or disseminating any of this information or taking any action in reliance on or regarding this information. If you have received this fax in error, please notify us immediately by telephone so that we can arrange for its return to [place]. Phone: [phone], Toll-Free: [phone], Fax:  [phone] Page: 3 of 3 Call Id: [id number] [place] [address] [place] [address] [phone] Fax: [phone] [place] - Day Date: 07/29/2015 From: QI Department To: [place], [place] Please sign the order for the approved drug(s) given by our call center nurse on your behalf. Fax to [phone] within 5 business days. Thank you. Date Eilene Ghazi Time): 07/29/2015 5:11:29 PM Triage RN: Willey Blade, RN NAME: Nolon Stalls PHONE NUMBER: [phone] (Primary) BIRTHDATE: 05-14-1961 ADDRESS: CITY/STATE/ZIP: [place] CALLER: Self NAME: Rx Given Medication Refill Route Dosage Regime Duration Admin Instructions Meloxicam 15 mg one PO daily Oral Days Dispense # 30 no refills MD Signature Date

## 2015-11-07 ENCOUNTER — Other Ambulatory Visit: Payer: Self-pay | Admitting: Family Medicine

## 2016-07-09 ENCOUNTER — Other Ambulatory Visit: Payer: Self-pay | Admitting: Family Medicine

## 2016-07-12 NOTE — Telephone Encounter (Signed)
Routing to PCP, pt hasn't been seen by Dr. Glori Bickers in over a year

## 2019-01-26 DIAGNOSIS — K0381 Cracked tooth: Secondary | ICD-10-CM | POA: Diagnosis not present

## 2020-01-31 ENCOUNTER — Ambulatory Visit: Payer: BLUE CROSS/BLUE SHIELD | Admitting: Family Medicine

## 2020-01-31 ENCOUNTER — Other Ambulatory Visit: Payer: Self-pay

## 2020-01-31 ENCOUNTER — Telehealth: Payer: Self-pay

## 2020-01-31 ENCOUNTER — Ambulatory Visit: Payer: BC Managed Care – PPO | Admitting: Family Medicine

## 2020-01-31 ENCOUNTER — Encounter: Payer: Self-pay | Admitting: Family Medicine

## 2020-01-31 ENCOUNTER — Ambulatory Visit (INDEPENDENT_AMBULATORY_CARE_PROVIDER_SITE_OTHER): Payer: BC Managed Care – PPO | Admitting: Family Medicine

## 2020-01-31 VITALS — BP 180/90 | HR 66 | Temp 98.0°F | Ht 70.0 in | Wt 195.4 lb

## 2020-01-31 DIAGNOSIS — E1169 Type 2 diabetes mellitus with other specified complication: Secondary | ICD-10-CM

## 2020-01-31 DIAGNOSIS — K439 Ventral hernia without obstruction or gangrene: Secondary | ICD-10-CM

## 2020-01-31 DIAGNOSIS — R9431 Abnormal electrocardiogram [ECG] [EKG]: Secondary | ICD-10-CM

## 2020-01-31 DIAGNOSIS — Z7289 Other problems related to lifestyle: Secondary | ICD-10-CM | POA: Diagnosis not present

## 2020-01-31 DIAGNOSIS — I10 Essential (primary) hypertension: Secondary | ICD-10-CM | POA: Diagnosis not present

## 2020-01-31 DIAGNOSIS — Z789 Other specified health status: Secondary | ICD-10-CM | POA: Insufficient documentation

## 2020-01-31 DIAGNOSIS — E785 Hyperlipidemia, unspecified: Secondary | ICD-10-CM

## 2020-01-31 DIAGNOSIS — F1090 Alcohol use, unspecified, uncomplicated: Secondary | ICD-10-CM

## 2020-01-31 DIAGNOSIS — I160 Hypertensive urgency: Secondary | ICD-10-CM

## 2020-01-31 LAB — COMPREHENSIVE METABOLIC PANEL
ALT: 17 U/L (ref 0–53)
AST: 22 U/L (ref 0–37)
Albumin: 4.7 g/dL (ref 3.5–5.2)
Alkaline Phosphatase: 52 U/L (ref 39–117)
BUN: 15 mg/dL (ref 6–23)
CO2: 30 mEq/L (ref 19–32)
Calcium: 9.8 mg/dL (ref 8.4–10.5)
Chloride: 98 mEq/L (ref 96–112)
Creatinine, Ser: 0.92 mg/dL (ref 0.40–1.50)
GFR: 84.37 mL/min (ref >60.00)
Glucose, Bld: 106 mg/dL — ABNORMAL HIGH (ref 70–99)
Potassium: 4.3 mEq/L (ref 3.5–5.1)
Sodium: 135 mEq/L (ref 135–145)
Total Bilirubin: 0.7 mg/dL (ref 0.2–1.2)
Total Protein: 7.9 g/dL (ref 6.0–8.3)

## 2020-01-31 LAB — LDL CHOLESTEROL, DIRECT: Direct LDL: 167 mg/dL

## 2020-01-31 LAB — CBC WITH DIFFERENTIAL/PLATELET
Basophils Absolute: 0.1 10*3/uL (ref 0.0–0.1)
Basophils Relative: 1 % (ref 0.0–3.0)
Eosinophils Absolute: 0.1 10*3/uL (ref 0.0–0.7)
Eosinophils Relative: 1 % (ref 0.0–5.0)
HCT: 47.9 % (ref 39.0–52.0)
Hemoglobin: 16.3 g/dL (ref 13.0–17.0)
Lymphocytes Relative: 25.2 % (ref 12.0–46.0)
Lymphs Abs: 2.4 10*3/uL (ref 0.7–4.0)
MCHC: 34.1 g/dL (ref 30.0–36.0)
MCV: 98.1 fl (ref 78.0–100.0)
Monocytes Absolute: 0.9 10*3/uL (ref 0.1–1.0)
Monocytes Relative: 9.6 % (ref 3.0–12.0)
Neutro Abs: 6.1 10*3/uL (ref 1.4–7.7)
Neutrophils Relative %: 63.2 % (ref 43.0–77.0)
Platelets: 203 10*3/uL (ref 150.0–400.0)
RBC: 4.89 Mil/uL (ref 4.22–5.81)
RDW: 13.2 % (ref 11.5–15.5)
WBC: 9.6 10*3/uL (ref 4.0–10.5)

## 2020-01-31 LAB — TSH: TSH: 3.25 u[IU]/mL (ref 0.35–4.50)

## 2020-01-31 MED ORDER — ATORVASTATIN CALCIUM 40 MG PO TABS: 40.0000 mg | ORAL_TABLET | Freq: Every day | ORAL | 6 refills | Status: DC

## 2020-01-31 MED ORDER — AMLODIPINE BESYLATE 5 MG PO TABS: 5.0000 mg | ORAL_TABLET | Freq: Every day | ORAL | 6 refills | Status: DC
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
## 2020-01-31 NOTE — Telephone Encounter (Signed)
Seen today. 

## 2020-01-31 NOTE — Patient Instructions (Addendum)
EKG today Vision check today Labs today Start amlodipine 5mg  daily. Return in 2 weeks for blood pressure follow up visit.   Your goal blood pressure is <140/90, ideally better if lower. Work on low salt/sodium diet - goal <2gm (2,000mg ) per day. Eat a diet high in fruits/vegetables and whole grains.  Look into mediterranean and DASH diet. Goal activity is 141min/wk of moderate intensity exercise.  This can be split into 30 minute chunks.  If you are not at this level, you can start with smaller 10-15 min increments and slowly build up activity. Look at [place].org for more resources   DASH Eating Plan DASH stands for "Dietary Approaches to Stop Hypertension." The DASH eating plan is a healthy eating plan that has been shown to reduce high blood pressure (hypertension). It may also reduce your risk for type 2 diabetes, heart disease, and stroke. The DASH eating plan may also help with weight loss. What are tips for following this plan?  General guidelines  Avoid eating more than 2,300 mg (milligrams) of salt (sodium) a day. If you have hypertension, you may need to reduce your sodium intake to 1,500 mg a day.  Limit alcohol intake to no more than 1 drink a day for nonpregnant women and 2 drinks a day for men. One drink equals 12 oz of beer, 5 oz of wine, or 1 oz of hard liquor.  Work with your health care provider to maintain a healthy body weight or to lose weight. Ask what an ideal weight is for you.  Get at least 30 minutes of exercise that causes your heart to beat faster (aerobic exercise) most days of the week. Activities may include walking, swimming, or biking.  Work with your health care provider or diet and nutrition specialist (dietitian) to adjust your eating plan to your individual calorie needs. Reading food labels   Check food labels for the amount of sodium per serving. Choose foods with less than 5 percent of the Daily Value of sodium. Generally, foods with less than  300 mg of sodium per serving fit into this eating plan.  To find whole grains, look for the word "whole" as the first word in the ingredient list. Shopping  Buy products labeled as "low-sodium" or "no salt added."  Buy fresh foods. Avoid canned foods and premade or frozen meals. Cooking  Avoid adding salt when cooking. Use salt-free seasonings or herbs instead of table salt or sea salt. Check with your health care provider or pharmacist before using salt substitutes.  Do not fry foods. Cook foods using healthy methods such as baking, boiling, grilling, and broiling instead.  Cook with heart-healthy oils, such as olive, canola, soybean, or sunflower oil. Meal planning  Eat a balanced diet that includes: ? 5 or more servings of fruits and vegetables each day. At each meal, try to fill half of your plate with fruits and vegetables. ? Up to 6-8 servings of whole grains each day. ? Less than 6 oz of lean meat, poultry, or fish each day. A 3-oz serving of meat is about the same size as a deck of cards. One egg equals 1 oz. ? 2 servings of low-fat dairy each day. ? A serving of nuts, seeds, or beans 5 times each week. ? Heart-healthy fats. Healthy fats called Omega-3 fatty acids are found in foods such as flaxseeds and coldwater fish, like sardines, salmon, and mackerel.  Limit how much you eat of the following: ? Canned or prepackaged foods. ?  Food that is high in trans fat, such as fried foods. ? Food that is high in saturated fat, such as fatty meat. ? Sweets, desserts, sugary drinks, and other foods with added sugar. ? Full-fat dairy products.  Do not salt foods before eating.  Try to eat at least 2 vegetarian meals each week.  Eat more home-cooked food and less restaurant, buffet, and fast food.  When eating at a restaurant, ask that your food be prepared with less salt or no salt, if possible. What foods are recommended? The items listed may not be a complete list. Talk with  your dietitian about what dietary choices are best for you. Grains Whole-grain or whole-wheat bread. Whole-grain or whole-wheat pasta. Brown rice. [place]. Bulgur. Whole-grain and low-sodium cereals. Pita bread. Low-fat, low-sodium crackers. Whole-wheat flour tortillas. Vegetables Fresh or frozen vegetables (raw, steamed, roasted, or grilled). Low-sodium or reduced-sodium tomato and vegetable juice. Low-sodium or reduced-sodium tomato sauce and tomato paste. Low-sodium or reduced-sodium canned vegetables. Fruits All fresh, dried, or frozen fruit. Canned fruit in natural juice (without added sugar). Meat and other protein foods Skinless chicken or [place]. Ground chicken or [place]. Pork with fat trimmed off. Fish and seafood. Egg whites. Dried beans, peas, or lentils. Unsalted nuts, nut butters, and seeds. Unsalted canned beans. Lean cuts of beef with fat trimmed off. Low-sodium, lean deli meat. Dairy Low-fat (1%) or fat-free (skim) milk. Fat-free, low-fat, or reduced-fat cheeses. Nonfat, low-sodium ricotta or cottage cheese. Low-fat or nonfat yogurt. Low-fat, low-sodium cheese. Fats and oils Soft margarine without trans fats. Vegetable oil. Low-fat, reduced-fat, or light mayonnaise and salad dressings (reduced-sodium). Canola, safflower, olive, soybean, and sunflower oils. Avocado. Seasoning and other foods Herbs. Spices. Seasoning mixes without salt. Unsalted popcorn and pretzels. Fat-free sweets. What foods are not recommended? The items listed may not be a complete list. Talk with your dietitian about what dietary choices are best for you. Grains Baked goods made with fat, such as croissants, muffins, or some breads. Dry pasta or rice meal packs. Vegetables Creamed or fried vegetables. Vegetables in a cheese sauce. Regular canned vegetables (not low-sodium or reduced-sodium). Regular canned tomato sauce and paste (not low-sodium or reduced-sodium). Regular tomato and vegetable juice  (not low-sodium or reduced-sodium). Angie Fava. Olives. Fruits Canned fruit in a light or heavy syrup. Fried fruit. Fruit in cream or butter sauce. Meat and other protein foods Fatty cuts of meat. Ribs. Fried meat. Berniece Salines. Sausage. Bologna and other processed lunch meats. Salami. Fatback. Hotdogs. Bratwurst. Salted nuts and seeds. Canned beans with added salt. Canned or smoked fish. Whole eggs or egg yolks. Chicken or [place] with skin. Dairy Whole or 2% milk, cream, and half-and-half. Whole or full-fat cream cheese. Whole-fat or sweetened yogurt. Full-fat cheese. Nondairy creamers. Whipped toppings. Processed cheese and cheese spreads. Fats and oils Butter. Stick margarine. Lard. Shortening. Ghee. Bacon fat. Tropical oils, such as coconut, palm kernel, or palm oil. Seasoning and other foods Salted popcorn and pretzels. Onion salt, garlic salt, seasoned salt, table salt, and sea salt. Worcestershire sauce. Tartar sauce. Barbecue sauce. Teriyaki sauce. Soy sauce, including reduced-sodium. Steak sauce. Canned and packaged gravies. Fish sauce. Oyster sauce. Cocktail sauce. Horseradish that you find on the shelf. Ketchup. Mustard. Meat flavorings and tenderizers. Bouillon cubes. Hot sauce and Tabasco sauce. Premade or packaged marinades. Premade or packaged taco seasonings. Relishes. Regular salad dressings. Where to find more information:  National Heart, Lung, and [place]: https://wilson-eaton.com/  American Heart Association: www.heart.org Summary  The DASH eating plan is  a healthy eating plan that has been shown to reduce high blood pressure (hypertension). It may also reduce your risk for type 2 diabetes, heart disease, and stroke.  With the DASH eating plan, you should limit salt (sodium) intake to 2,300 mg a day. If you have hypertension, you may need to reduce your sodium intake to 1,500 mg a day.  When on the DASH eating plan, aim to eat more fresh fruits and vegetables, whole grains, lean  proteins, low-fat dairy, and heart-healthy fats.  Work with your health care provider or diet and nutrition specialist (dietitian) to adjust your eating plan to your individual calorie needs. This information is not intended to replace advice given to you by your health care provider. Make sure you discuss any questions you have with your health care provider. Document Revised: 06/16/2017 Document Reviewed: 06/27/2016 Elsevier Patient Education  2020 [employer].

## 2020-01-31 NOTE — Telephone Encounter (Signed)
Pt walked in; pt said feels fluttery inside. 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 Pt drinking a lot of water since works in the outdoor heat; pt said he is eating but he can eat 2 crackers and feels full. For 1 wk pt has slight dull h/a on and off for 1 wk. Last wk pt took BP at [facility] 187/87.; pt said for ? How long grip in hands come and go and floaters in eyes on and off. Today pt feels OK but concerned about BP. Pt takes ASA 81 mg daily and vitamins. Pt has no covid symptoms, no travel and no known exposure to + covid. Pt has not had covid vaccine.  Now BP 178/90 lt arm, reg cuff, sitting. Pulse ox 98% P 66. Dr Darnell Level will see pt this morning. Pt being seen now.

## 2020-02-01 DIAGNOSIS — K439 Ventral hernia without obstruction or gangrene: Secondary | ICD-10-CM | POA: Insufficient documentation

## 2020-02-01 DIAGNOSIS — E785 Hyperlipidemia, unspecified: Secondary | ICD-10-CM | POA: Insufficient documentation

## 2020-02-01 DIAGNOSIS — R9431 Abnormal electrocardiogram [ECG] [EKG]: Secondary | ICD-10-CM | POA: Insufficient documentation

## 2020-02-01 NOTE — Assessment & Plan Note (Signed)
H/o mild hyperlipidemia - update dLDL today (not fasting) and start statin given abnormal EKG while we further risk stratify with cardiac eval.  The ASCVD Risk score Mikey Bussing DC Jr., et al., 2013) failed to calculate for the following reasons:   Cannot find a previous HDL lab   Cannot find a previous total cholesterol lab

## 2020-02-01 NOTE — Assessment & Plan Note (Addendum)
Down to 4-5 beers/night (from 12-pack). Encouraged continued taper, reviewed health risks of longterm alcohol intake.

## 2020-02-01 NOTE — Assessment & Plan Note (Addendum)
EKG meets criteria for LVH and possible heart strain in setting of uncontrolled hypertension - check echo, refer to cardiology, work towards better blood pressure control. Pt agrees with plan. He has not had any chest pain/tightness or dyspnea. Red flags to call 911 reviewed.  Also noted abnormal variable PR intervals but no dropped QRS - ?2nd degree AV block vs other form of abnormal AV conduction.

## 2020-02-01 NOTE — Assessment & Plan Note (Addendum)
New over the past 6 months, did not seek medical care until now.  Now presenting with hypertensive urgency associated with some dizziness.  Reviewed microvascular and macrovascular risks of uncontrolled hypertension and reasons to treat to prevent these. Reviewed healthy diet and lifestyle choices to help improve blood pressures. Handout provided.  Check labs, EKG, vision screen today.  Start amlodipine 5mg  daily, RTC 2 wks f/u visit.  Given abnormal EKG, will also order echo (eval LVH) and refer to cardio (risk stratify heart strain).  Red flags to seek ER care reviewed.  Continue aspirin, update dLDL, start statin.

## 2020-02-01 NOTE — Assessment & Plan Note (Signed)
H/o this - persists, asxs at this time. Anticipate both small umbilical and epigastric hernias present. Also has h/o R inguinal that has not yet been treated. Will further discuss in the future.

## 2020-02-03 ENCOUNTER — Telehealth: Payer: Self-pay

## 2020-02-03 NOTE — Telephone Encounter (Signed)
Attempted to contact pt.  No answer.  Vm box not set up.  Need to relay results and Dr. Synthia Innocent message.   Labs/message: Your labs returned ok, cholesterol levels were worse - agree with starting atorvastatin 40mg , as sent to pharmacy.  Will await cardiology appt and heart ultrasound.  Keep f/u in 2 wks.

## 2020-02-04 NOTE — Telephone Encounter (Signed)
Pt returning call.  Relayed results and Dr. Synthia Innocent message.  Pt verbalizes understanding stating he started atorvastatin 01/31/20.

## 2020-02-04 NOTE — Telephone Encounter (Signed)
Attempted to contact pt.  No answer.  Vm box not set up.  Need to relay results and Dr. Synthia Innocent message.   Labs/message: Your labs returned ok, cholesterol levels were worse - agree with starting atorvastatin 40mg , as sent to pharmacy.  Will await cardiology appt and heart ultrasound.  Keep f/u in 2 wks.

## 2020-02-12 ENCOUNTER — Other Ambulatory Visit: Payer: Self-pay | Admitting: Family Medicine

## 2020-02-12 DIAGNOSIS — R9431 Abnormal electrocardiogram [ECG] [EKG]: Secondary | ICD-10-CM

## 2020-02-12 DIAGNOSIS — R001 Bradycardia, unspecified: Secondary | ICD-10-CM

## 2020-02-14 ENCOUNTER — Encounter: Payer: Self-pay | Admitting: Family Medicine

## 2020-02-14 ENCOUNTER — Other Ambulatory Visit: Payer: Self-pay

## 2020-02-14 ENCOUNTER — Ambulatory Visit (INDEPENDENT_AMBULATORY_CARE_PROVIDER_SITE_OTHER): Payer: BC Managed Care – PPO

## 2020-02-14 ENCOUNTER — Ambulatory Visit (INDEPENDENT_AMBULATORY_CARE_PROVIDER_SITE_OTHER): Payer: BC Managed Care – PPO | Admitting: Family Medicine

## 2020-02-14 VITALS — BP 138/80 | HR 59 | Temp 97.4°F | Wt 198.1 lb

## 2020-02-14 DIAGNOSIS — Z789 Other specified health status: Secondary | ICD-10-CM

## 2020-02-14 DIAGNOSIS — I10 Essential (primary) hypertension: Secondary | ICD-10-CM | POA: Diagnosis not present

## 2020-02-14 DIAGNOSIS — Z7289 Other problems related to lifestyle: Secondary | ICD-10-CM

## 2020-02-14 DIAGNOSIS — E785 Hyperlipidemia, unspecified: Secondary | ICD-10-CM | POA: Diagnosis not present

## 2020-02-14 DIAGNOSIS — R001 Bradycardia, unspecified: Secondary | ICD-10-CM

## 2020-02-14 DIAGNOSIS — R9431 Abnormal electrocardiogram [ECG] [EKG]: Secondary | ICD-10-CM | POA: Diagnosis not present

## 2020-02-14 LAB — ECHOCARDIOGRAM COMPLETE
AR max vel: 4.08 cm2
AV Area VTI: 3.77 cm2
AV Area mean vel: 3.97 cm2
AV Mean grad: 4 mmHg
AV Peak grad: 6.3 mmHg
Ao pk vel: 1.25 m/s
Area-P 1/2: 2.85 cm2
Calc EF: 48.6 %
S' Lateral: 4.4 cm
Single Plane A2C EF: 48.6 %
Single Plane A4C EF: 48.5 %
Weight: 3169 oz

## 2020-02-14 NOTE — Assessment & Plan Note (Signed)
Cutting down on alcohol use.

## 2020-02-14 NOTE — Assessment & Plan Note (Addendum)
dLDL high recently. Tolerating atorvastatin 40mg  daily. Continue. Check FLP at next fasting labs.

## 2020-02-14 NOTE — Assessment & Plan Note (Signed)
Marked improvement with healthy diet and starting amlodipine 5mg  daily - home readings have all been in range. Will continue this. Pending echocardiogram later today and cards f/u next week. RTC 3 mo CPE with fasting labs.

## 2020-02-14 NOTE — Progress Notes (Signed)
This visit was conducted in person.  BP (!) 138/80   Pulse 59   Temp (!) 97.4 F (36.3 C) (Temporal)   Wt 198 lb 1 oz (89.8 kg)   SpO2 97%   BMI 28.42 kg/m    CC: 3 wk f/u visit  Subjective:    Patient ID: Caleb Spencer, male    DOB: [birth year], 59 y.o.   MRN: [id number]  HPI: Caleb Spencer is a 59 y.o. male presenting on 02/14/2020 for Follow-up (2 weeks- BP )   See prior note for details.  Re established care 2 wks ago with hypertensive urgency with dizziness. We started amlodipine and atorvastatin at that time. Tolerating well.  Has echocardiogram scheduled for later today and cardiology appt scheduled 02/17/2020.   Here for HTN f/u.  He's changed diet - using ms dash, decreasing salt intake, down to 1 mt dew per day, increasing water intake.  No HA, vision changes, SOB, leg swelling.  Has cut back on alcohol - down to 2-3 drinks/day. Drinks 24 oz cans.   Home BP readings running much better controlled!  117-140/66-80s.      Relevant past medical, surgical, family and social history reviewed and updated as indicated. Interim medical history since our last visit reviewed. Allergies and medications reviewed and updated. Outpatient Medications Prior to Visit  Medication Sig Dispense Refill  . amLODipine (NORVASC) 5 MG tablet Take 1 tablet (5 mg total) by mouth daily. 30 tablet 6  . ASPIRIN 81 PO Take by mouth daily.    Marland Kitchen atorvastatin (LIPITOR) 40 MG tablet Take 1 tablet (40 mg total) by mouth daily. 30 tablet 6  . Ibuprofen (MOTRIN PO) Take by mouth as needed. As needed for neck pain    . Multiple Vitamin (MULTIVITAMIN PO) Take 1 tablet by mouth daily.       No facility-administered medications prior to visit.     Per HPI unless specifically indicated in ROS section below Review of Systems Objective:  BP (!) 138/80   Pulse 59   Temp (!) 97.4 F (36.3 C) (Temporal)   Wt 198 lb 1 oz (89.8 kg)   SpO2 97%   BMI 28.42 kg/m   Wt Readings from Last 3 Encounters:    02/14/20 198 lb 1 oz (89.8 kg)  01/31/20 195 lb 6 oz (88.6 kg)  05/04/15 205 lb 12 oz (93.3 kg)      Physical Exam Vitals and nursing note reviewed.  Constitutional:      Appearance: Normal appearance. He is not ill-appearing.  Cardiovascular:     Rate and Rhythm: Normal rate and regular rhythm.     Pulses: Normal pulses.     Heart sounds: Normal heart sounds. No murmur heard.   Pulmonary:     Effort: Pulmonary effort is normal. No respiratory distress.     Breath sounds: Normal breath sounds. No wheezing, rhonchi or rales.  Musculoskeletal:     Right lower leg: No edema.     Left lower leg: No edema.  Neurological:     Mental Status: He is alert.  Psychiatric:        Mood and Affect: Mood normal.        Behavior: Behavior normal.       Results for orders placed or performed in visit on 01/31/20  CBC with Differential/Platelet  Result Value Ref Range   WBC 9.6 4.0 - 10.5 K/uL   RBC 4.89 4.22 - 5.81 Mil/uL   Hemoglobin 16.3 13.0 -  17.0 g/dL   HCT 47.9 39 - 52 %   MCV 98.1 78.0 - 100.0 fl   MCHC 34.1 30.0 - 36.0 g/dL   RDW 13.2 11.5 - 15.5 %   Platelets 203.0 150 - 400 K/uL   Neutrophils Relative % 63.2 43 - 77 %   Lymphocytes Relative 25.2 12 - 46 %   Monocytes Relative 9.6 3 - 12 %   Eosinophils Relative 1.0 0 - 5 %   Basophils Relative 1.0 0 - 3 %   Neutro Abs 6.1 1.4 - 7.7 K/uL   Lymphs Abs 2.4 0.7 - 4.0 K/uL   Monocytes Absolute 0.9 0 - 1 K/uL   Eosinophils Absolute 0.1 0 - 0 K/uL   Basophils Absolute 0.1 0 - 0 K/uL  Comprehensive metabolic panel  Result Value Ref Range   Sodium 135 135 - 145 mEq/L   Potassium 4.3 3.5 - 5.1 mEq/L   Chloride 98 96 - 112 mEq/L   CO2 30 19 - 32 mEq/L   Glucose, Bld 106 (H) 70 - 99 mg/dL   BUN 15 6 - 23 mg/dL   Creatinine, Ser 0.92 0.40 - 1.50 mg/dL   Total Bilirubin 0.7 0.2 - 1.2 mg/dL   Alkaline Phosphatase 52 39 - 117 U/L   AST 22 0 - 37 U/L   ALT 17 0 - 53 U/L   Total Protein 7.9 6.0 - 8.3 g/dL   Albumin 4.7 3.5 -  5.2 g/dL   GFR 84.37 >60.00 mL/min   Calcium 9.8 8.4 - 10.5 mg/dL  TSH  Result Value Ref Range   TSH 3.25 0.35 - 4.50 uIU/mL  LDL Cholesterol, Direct  Result Value Ref Range   Direct LDL 167.0 mg/dL   Assessment & Plan:  This visit occurred during the SARS-CoV-2 public health emergency.  Safety protocols were in place, including screening questions prior to the visit, additional usage of staff PPE, and extensive cleaning of exam room while observing appropriate contact time as indicated for disinfecting solutions.   Problem List Items Addressed This Visit    Hypertension - Primary    Marked improvement with healthy diet and starting amlodipine 5mg  daily - home readings have all been in range. Will continue this. Pending echocardiogram later today and cards f/u next week. RTC 3 mo CPE with fasting labs.       Hyperlipidemia    dLDL high recently. Tolerating atorvastatin 40mg  daily. Continue. Check FLP at next fasting labs.       Alcohol use    Cutting down on alcohol use.           No orders of the defined types were placed in this encounter.  No orders of the defined types were placed in this encounter.  Patient Instructions  You are doing much better! Continue current medicines. We will await heart ultrasound and cardiology appointment next week.  Schedule physical at your convenience in 2-3 months   Follow up plan: Return in about 3 months (around 05/16/2020) for annual exam, prior fasting for blood work.  Ria Bush, MD

## 2020-02-14 NOTE — Patient Instructions (Addendum)
You are doing much better! Continue current medicines. We will await heart ultrasound and cardiology appointment next week.  Schedule physical at your convenience in 2-3 months

## 2020-02-17 ENCOUNTER — Encounter: Payer: Self-pay | Admitting: Cardiology

## 2020-02-17 ENCOUNTER — Ambulatory Visit (INDEPENDENT_AMBULATORY_CARE_PROVIDER_SITE_OTHER): Payer: BC Managed Care – PPO | Admitting: Cardiology

## 2020-02-17 ENCOUNTER — Other Ambulatory Visit: Payer: Self-pay

## 2020-02-17 VITALS — BP 162/84 | HR 56 | Ht 72.0 in | Wt 202.0 lb

## 2020-02-17 DIAGNOSIS — R9431 Abnormal electrocardiogram [ECG] [EKG]: Secondary | ICD-10-CM

## 2020-02-17 DIAGNOSIS — I10 Essential (primary) hypertension: Secondary | ICD-10-CM | POA: Diagnosis not present

## 2020-02-17 DIAGNOSIS — E7800 Pure hypercholesterolemia, unspecified: Secondary | ICD-10-CM

## 2020-02-17 MED ORDER — AMLODIPINE BESYLATE 5 MG PO TABS
5.0000 mg | ORAL_TABLET | Freq: Two times a day (BID) | ORAL | 6 refills | Status: DC
Start: 2020-02-17 — End: 2020-02-17

## 2020-02-17 MED ORDER — AMLODIPINE BESYLATE 5 MG PO TABS
5.0000 mg | ORAL_TABLET | Freq: Two times a day (BID) | ORAL | 5 refills | Status: DC
Start: 2020-02-17 — End: 2020-03-20

## 2020-02-17 NOTE — Patient Instructions (Signed)
Medication Instructions:  Your physician has recommended you make the following change in your medication:   INCREASE your amLODipine (NORVASC) 5 MG tablet: Take 1 tablet (5 mg total) by mouth in the morning and at bedtime.  *If you need a refill on your cardiac medications before your next appointment, please call your pharmacy*   Lab Work: None Ordered If you have labs (blood work) drawn today and your tests are completely normal, you will receive your results only by:  [place] (if you have MyChart) OR  A paper copy in the mail If you have any lab test that is abnormal or we need to change your treatment, we will call you to review the results.   Testing/Procedures: None Ordered   Follow-Up: At [facility], you and your health needs are our priority.  As part of our continuing mission to provide you with exceptional heart care, we have created designated Provider Care Teams.  These Care Teams include your primary Cardiologist (physician) and Advanced Practice Providers (APPs -  Physician Assistants and Nurse Practitioners) who all work together to provide you with the care you need, when you need it.  We recommend signing up for the patient portal called "MyChart".  Sign up information is provided on this After Visit Summary.  MyChart is used to connect with patients for Virtual Visits (Telemedicine).  Patients are able to view lab/test results, encounter notes, upcoming appointments, etc.  Non-urgent messages can be sent to your provider as well.   To learn more about what you can do with MyChart, go to [email or web address].    Your next appointment:   1 month(s)  The format for your next appointment:   In Person  Provider:   Kate Sable, MD   Other Instructions  N/A

## 2020-02-17 NOTE — Progress Notes (Signed)
Cardiology Office Note:    Date:  02/17/2020   ID:  [name], DOB 1960/09/19, MRN [id number]  PCP:  Ria Bush, MD  [facility] Cardiologist:  Kate Sable, MD  [place] Electrophysiologist:  None   Referring MD: Ria Bush, MD   Chief Complaint  Patient presents with  . New Patient (Initial Visit)    Referred by Biagio Borg for hypertension, hypertensive urgency and abnormal EKG. Meds reviewed verbally with patient.    Caleb Spencer is a 59 y.o. male who is being seen today for the evaluation of hypertension at the request of Ria Bush, MD.   History of Present Illness:    Caleb Spencer is a 59 y.o. male with a hx of hypertension, hyperlipidemia who presents due to hypertension and abnormal ECG.  Patient with history of elevated blood pressures.  Started on amlodipine by primary care provider.  EKG was performed at PCPs office which showed LVH.  Patient states his blood pressure has improved since starting blood pressure medication.  He does not have a cuff at home but usually checks his blood pressure at [employer].  Blood pressure readings are typically 423- 536 systolic.  Patient states cutting down on his drinking, drank over 12 beers per day, currently drinks 3 beers daily.  Occasionally smokes cigars.  He has cut down salt from his diet.  Patient was recently started on Lipitor, tolerating without any adverse effects.  Echocardiogram on 02/14/2020 showed normal systolic and diastolic function, EF 14%, mild AI.  No LVH noted on echocardiogram.  Past Medical History:  Diagnosis Date  . History of smoking   . Kidney stone   . Left groin hernia    working on finances to be able to do    Past Surgical History:  Procedure Laterality Date  . INGUINAL HERNIA REPAIR  1987   Left    Current Medications: Current Meds  Medication Sig  . amLODipine (NORVASC) 5 MG tablet Take 1 tablet (5 mg total) by mouth in the morning and at bedtime.  .  ASPIRIN 81 PO Take by mouth daily.  Marland Kitchen atorvastatin (LIPITOR) 40 MG tablet Take 1 tablet (40 mg total) by mouth daily.  . Ibuprofen (MOTRIN PO) Take by mouth as needed. As needed for neck pain  . Multiple Vitamin (MULTIVITAMIN PO) Take 1 tablet by mouth daily.    . [DISCONTINUED] amLODipine (NORVASC) 5 MG tablet Take 1 tablet (5 mg total) by mouth daily.  . [DISCONTINUED] amLODipine (NORVASC) 5 MG tablet Take 1 tablet (5 mg total) by mouth in the morning and at bedtime.     Allergies:   Patient has no known allergies.   Social History   Socioeconomic History  . Marital status: Single    Spouse name: Not on file  . Number of children: Not on file  . Years of education: 10th  . Highest education level: Not on file  Occupational History  . Occupation: Psychiatrist: [employer]  Tobacco Use  . Smoking status: Former Smoker    Types: Cigarettes  . Smokeless tobacco: Never Used  Substance and Sexual Activity  . Alcohol use: Yes    Alcohol/week: 0.0 standard drinks    Comment: 6 pack/weekly-possibly a little more  . Drug use: No  . Sexual activity: Not on file  Other Topics Concern  . Not on file  Social History Narrative   Caffeine: 1/2 pot coffee   Lives with mother, no  pets   Social Determinants of Health   Financial Resource Strain:   . Difficulty of Paying Living Expenses:   Food Insecurity:   . Worried About Charity fundraiser in the Last Year:   . Arboriculturist in the Last Year:   Transportation Needs:   . Film/video editor (Medical):   Marland Kitchen Lack of Transportation (Non-Medical):   Physical Activity:   . Days of Exercise per Week:   . Minutes of Exercise per Session:   Stress:   . Feeling of Stress :   Social Connections:   . Frequency of Communication with Friends and Family:   . Frequency of Social Gatherings with Friends and Family:   . Attends Religious Services:   . Active Member of Clubs or Organizations:   . Attends  Archivist Meetings:   Marland Kitchen Marital Status:      Family History: The patient's family history includes Alcohol abuse in his father; Coronary artery disease in his father; Diabetes in his mother; Lung cancer in his maternal aunt. There is no history of Stroke.  ROS:   Please see the history of present illness.     All other systems reviewed and are negative.  EKGs/Labs/Other Studies Reviewed:    The following studies were reviewed today:   EKG:  EKG is  ordered today.  The ekg ordered today demonstrates sinus bradycardia, heart rate 56  Recent Labs: 01/31/2020: ALT 17; BUN 15; Creatinine, Ser 0.92; Hemoglobin 16.3; Platelets 203.0; Potassium 4.3; Sodium 135; TSH 3.25  Recent Lipid Panel    Component Value Date/Time   CHOL 208 (H) 02/07/2011 0830   TRIG 145.0 02/07/2011 0830   HDL 58.40 02/07/2011 0830   CHOLHDL 4 02/07/2011 0830   VLDL 29.0 02/07/2011 0830   LDLDIRECT 167.0 01/31/2020 1203    Physical Exam:    VS:  BP (!) 162/84 (BP Location: Right Arm, Patient Position: Sitting, Cuff Size: Normal)   Pulse (!) 56   Ht 6' (1.829 m)   Wt 202 lb (91.6 kg)   SpO2 94%   BMI 27.40 kg/m     Wt Readings from Last 3 Encounters:  02/17/20 202 lb (91.6 kg)  02/14/20 198 lb 1 oz (89.8 kg)  01/31/20 195 lb 6 oz (88.6 kg)     GEN:  Well nourished, well developed in no acute distress HEENT: Normal NECK: No JVD; No carotid bruits LYMPHATICS: No lymphadenopathy CARDIAC: RRR, no murmurs, rubs, gallops RESPIRATORY:  Clear to auscultation without rales, wheezing or rhonchi  ABDOMEN: Soft, non-tender, non-distended MUSCULOSKELETAL:  No edema; No deformity  SKIN: Warm and dry NEUROLOGIC:  Alert and oriented x 3 PSYCHIATRIC:  Normal affect   ASSESSMENT:    1. Essential hypertension   2. Pure hypercholesterolemia   3. Nonspecific abnormal electrocardiogram (ECG) (EKG)    PLAN:    In order of problems listed above:  1. Patient with history of hypertension, BP improved  but still elevated.  Increase Norvasc to 5 mg twice daily.  If patient tolerates Norvasc, and blood pressure controlled, can consolidate to 10 mg once daily. 2. History of hyperlipidemia, continue Lipitor as prescribed. 3. Patient with history of LVH with strain pattern on EKG.  Echocardiogram showed normal systolic and diastolic function, no LVH on echo, EF 60%.  Mild aortic regurgitation.  Continue to monitor and manage risk factors including hypertension and hyperlipidemia also improved.  Total encounter time 60 minutes  Greater than 50% was spent in counseling and  coordination of care with the patient   Medication Adjustments/Labs and Tests Ordered: Current medicines are reviewed at length with the patient today.  Concerns regarding medicines are outlined above.  Orders Placed This Encounter  Procedures  . EKG 12-Lead   Meds ordered this encounter  Medications  . DISCONTD: amLODipine (NORVASC) 5 MG tablet    Sig: Take 1 tablet (5 mg total) by mouth in the morning and at bedtime.    Dispense:  30 tablet    Refill:  6  . amLODipine (NORVASC) 5 MG tablet    Sig: Take 1 tablet (5 mg total) by mouth in the morning and at bedtime.    Dispense:  60 tablet    Refill:  5    Patient Instructions  Medication Instructions:  Your physician has recommended you make the following change in your medication:   INCREASE your amLODipine (NORVASC) 5 MG tablet: Take 1 tablet (5 mg total) by mouth in the morning and at bedtime.  *If you need a refill on your cardiac medications before your next appointment, please call your pharmacy*   Lab Work: None Ordered If you have labs (blood work) drawn today and your tests are completely normal, you will receive your results only by: Marland Kitchen MyChart Message (if you have MyChart) OR . A paper copy in the mail If you have any lab test that is abnormal or we need to change your treatment, we will call you to review the results.   Testing/Procedures: None  Ordered   Follow-Up: At [facility], you and your health needs are our priority.  As part of our continuing mission to provide you with exceptional heart care, we have created designated Provider Care Teams.  These Care Teams include your primary Cardiologist (physician) and Advanced Practice Providers (APPs -  Physician Assistants and Nurse Practitioners) who all work together to provide you with the care you need, when you need it.  We recommend signing up for the patient portal called "MyChart".  Sign up information is provided on this After Visit Summary.  MyChart is used to connect with patients for Virtual Visits (Telemedicine).  Patients are able to view lab/test results, encounter notes, upcoming appointments, etc.  Non-urgent messages can be sent to your provider as well.   To learn more about what you can do with MyChart, go to [email or web address].    Your next appointment:   1 month(s)  The format for your next appointment:   In Person  Provider:   Kate Sable, MD   Other Instructions  N/A     Signed, Kate Sable, MD  02/17/2020 12:47 PM    [place]

## 2020-02-28 ENCOUNTER — Telehealth: Payer: Self-pay

## 2020-02-28 NOTE — Telephone Encounter (Signed)
Attempted to contact pt.  No answer.  Vm box not set up.  Need to relay ECHO results.  ECHO: Your heart ultrasound returned showing normal heart pumping function, with borderline dilatation of base of aorta.  We will just watch this.  Continue working towards good blood pressure control.

## 2020-03-02 NOTE — Telephone Encounter (Signed)
Attempted to contact pt.  No answer.  Vm box not set up.  Need to relay ECHO results.  ECHO: Your heart ultrasound returned showing normal heart pumping function, with borderline dilatation of base of aorta.  We will just watch this.  Continue working towards good blood pressure control.

## 2020-03-03 NOTE — Telephone Encounter (Signed)
Spoke with pt relaying results and message per Dr. G.  Pt verbalizes understanding. 

## 2020-03-20 ENCOUNTER — Encounter: Payer: Self-pay | Admitting: Cardiology

## 2020-03-20 ENCOUNTER — Ambulatory Visit (INDEPENDENT_AMBULATORY_CARE_PROVIDER_SITE_OTHER): Payer: BC Managed Care – PPO | Admitting: Cardiology

## 2020-03-20 ENCOUNTER — Other Ambulatory Visit: Payer: Self-pay

## 2020-03-20 VITALS — BP 120/72 | HR 61 | Ht 72.0 in | Wt 204.1 lb

## 2020-03-20 DIAGNOSIS — E7800 Pure hypercholesterolemia, unspecified: Secondary | ICD-10-CM | POA: Diagnosis not present

## 2020-03-20 DIAGNOSIS — I10 Essential (primary) hypertension: Secondary | ICD-10-CM

## 2020-03-20 MED ORDER — AMLODIPINE BESYLATE 10 MG PO TABS
10.0000 mg | ORAL_TABLET | Freq: Every day | ORAL | 5 refills | Status: DC
Start: 2020-03-20 — End: 2020-12-22

## 2020-03-20 NOTE — Patient Instructions (Signed)
Medication Instructions:   Your physician has recommended you make the following change in your medication:   Change your Amlodipine to 10mg  once a day by mouth.  *If you need a refill on your cardiac medications before your next appointment, please call your pharmacy*   Lab Work:  Your physician recommends that you return for a FASTING lipid profile: just prior to your 6 mos follow up.  - You will need to be fasting. Please do not have anything to eat or drink after midnight the morning you have the lab work. You may only have water or black coffee with no cream or sugar. - Please go to the [facility]. You will check in at the front desk to the right as you walk into the atrium. Valet Parking is offered if needed. - No appointment needed. You may go any day between 7 am and 6 pm.  If you have labs (blood work) drawn today and your tests are completely normal, you will receive your results only by: Marland Kitchen MyChart Message (if you have MyChart) OR . A paper copy in the mail If you have any lab test that is abnormal or we need to change your treatment, we will call you to review the results.   Testing/Procedures: None Ordered   Follow-Up: At [facility], you and your health needs are our priority.  As part of our continuing mission to provide you with exceptional heart care, we have created designated Provider Care Teams.  These Care Teams include your primary Cardiologist (physician) and Advanced Practice Providers (APPs -  Physician Assistants and Nurse Practitioners) who all work together to provide you with the care you need, when you need it.  We recommend signing up for the patient portal called "MyChart".  Sign up information is provided on this After Visit Summary.  MyChart is used to connect with patients for Virtual Visits (Telemedicine).  Patients are able to view lab/test results, encounter notes, upcoming appointments, etc.  Non-urgent messages can be sent to your  provider as well.   To learn more about what you can do with MyChart, go to [email or web address].    Your next appointment:   6 month(s)  The format for your next appointment:   In Person  Provider:   Kate Sable, MD   Other Instructions

## 2020-03-20 NOTE — Progress Notes (Signed)
Cardiology Office Note:    Date:  03/20/2020   ID:  Caleb Spencer, DOB Jul 18, 1961, MRN [id number]  PCP:  Ria Bush, MD  [facility] Cardiologist:  Kate Sable, MD  [place] Electrophysiologist:  None   Referring MD: Ria Bush, MD   Chief Complaint  Patient presents with  . other    1 month follow up. Meds reviewed by the pt. verbally. "doing well."      History of Present Illness:    Caleb Spencer is a 59 y.o. male with a hx of hypertension, hyperlipidemia who presents for follow-up.  Patient last seen due to uncontrolled hypertension and ECG showing LVH.  Echo showed normal systolic function with no LVH on EKG.  Amlodipine was increased to 5 mg twice daily.  He now presents for results.  Patient endorsed working on reducing amount of alcohol intake, currently drinks about 3 beers daily.  Patient states stopping all tobacco products.  He states feeling well, blood pressure is adequately controlled, he checks his blood pressure at home and systolic is usually in the 120s.   Prior notes Echocardiogram on 02/14/2020 showed normal systolic and diastolic function, EF 86%, mild AI.  No LVH noted on echocardiogram.  Past Medical History:  Diagnosis Date  . History of smoking   . Kidney stone   . Left groin hernia    working on finances to be able to do    Past Surgical History:  Procedure Laterality Date  . INGUINAL HERNIA REPAIR  1987   Left    Current Medications: Current Meds  Medication Sig  . amLODipine (NORVASC) 10 MG tablet Take 1 tablet (10 mg total) by mouth daily.  . ASPIRIN 81 PO Take by mouth daily.  Marland Kitchen atorvastatin (LIPITOR) 40 MG tablet Take 1 tablet (40 mg total) by mouth daily.  . Ibuprofen (MOTRIN PO) Take by mouth as needed. As needed for neck pain  . Multiple Vitamin (MULTIVITAMIN PO) Take 1 tablet by mouth daily.    . [DISCONTINUED] amLODipine (NORVASC) 5 MG tablet Take 1 tablet (5 mg total) by mouth in the morning and at  bedtime.     Allergies:   Patient has no known allergies.   Social History   Socioeconomic History  . Marital status: Single    Spouse name: Not on file  . Number of children: Not on file  . Years of education: 10th  . Highest education level: Not on file  Occupational History  . Occupation: Psychiatrist: [employer]  Tobacco Use  . Smoking status: Former Smoker    Types: Cigarettes  . Smokeless tobacco: Never Used  Vaping Use  . Vaping Use: Never used  Substance and Sexual Activity  . Alcohol use: Yes    Alcohol/week: 0.0 standard drinks    Comment: 6 pack/weekly-possibly a little more  . Drug use: No  . Sexual activity: Not on file  Other Topics Concern  . Not on file  Social History Narrative   Caffeine: 1/2 pot coffee   Lives with mother, no pets   Social Determinants of Health   Financial Resource Strain:   . Difficulty of Paying Living Expenses: Not on file  Food Insecurity:   . Worried About Charity fundraiser in the Last Year: Not on file  . Ran Out of Food in the Last Year: Not on file  Transportation Needs:   . Lack of Transportation (Medical): Not on file  .  Lack of Transportation (Non-Medical): Not on file  Physical Activity:   . Days of Exercise per Week: Not on file  . Minutes of Exercise per Session: Not on file  Stress:   . Feeling of Stress : Not on file  Social Connections:   . Frequency of Communication with Friends and Family: Not on file  . Frequency of Social Gatherings with Friends and Family: Not on file  . Attends Religious Services: Not on file  . Active Member of Clubs or Organizations: Not on file  . Attends Archivist Meetings: Not on file  . Marital Status: Not on file     Family History: The patient's family history includes Alcohol abuse in his father; Coronary artery disease in his father; Diabetes in his mother; Lung cancer in his maternal aunt. There is no history of  Stroke.  ROS:   Please see the history of present illness.     All other systems reviewed and are negative.  EKGs/Labs/Other Studies Reviewed:    The following studies were reviewed today:   EKG:  EKG is  ordered today.  The ekg ordered today demonstrates normal sinus rhythm.  Recent Labs: 01/31/2020: ALT 17; BUN 15; Creatinine, Ser 0.92; Hemoglobin 16.3; Platelets 203.0; Potassium 4.3; Sodium 135; TSH 3.25  Recent Lipid Panel    Component Value Date/Time   CHOL 208 (H) 02/07/2011 0830   TRIG 145.0 02/07/2011 0830   HDL 58.40 02/07/2011 0830   CHOLHDL 4 02/07/2011 0830   VLDL 29.0 02/07/2011 0830   LDLDIRECT 167.0 01/31/2020 1203    Physical Exam:    VS:  BP 120/72 (BP Location: Left Arm, Patient Position: Sitting, Cuff Size: Normal)   Pulse 61   Ht 6' (1.829 m)   Wt 204 lb 2 oz (92.6 kg)   SpO2 98%   BMI 27.68 kg/m     Wt Readings from Last 3 Encounters:  03/20/20 204 lb 2 oz (92.6 kg)  02/17/20 202 lb (91.6 kg)  02/14/20 198 lb 1 oz (89.8 kg)     GEN:  Well nourished, well developed in no acute distress HEENT: Normal NECK: No JVD; No carotid bruits LYMPHATICS: No lymphadenopathy CARDIAC: RRR, no murmurs, rubs, gallops RESPIRATORY:  Clear to auscultation without rales, wheezing or rhonchi  ABDOMEN: Soft, non-tender, non-distended MUSCULOSKELETAL:  No edema; No deformity  SKIN: Warm and dry NEUROLOGIC:  Alert and oriented x 3 PSYCHIATRIC:  Normal affect   ASSESSMENT:    1. Essential hypertension   2. Pure hypercholesterolemia    PLAN:    In order of problems listed above:  1. Patient with history of hypertension, BP is now well controlled. consolidate amlodipine, and prescribed patient 10 mg tablets to take 1 tablet daily. 2. History of hyperlipidemia, check fasting lipid profile in 6 months, continue Lipitor 40 mg daily.  Follow-up in 6 months after fasting lipid profile.  Total encounter time 40 minutes  Greater than 50% was spent in counseling  and coordination of care with the patient   Medication Adjustments/Labs and Tests Ordered: Current medicines are reviewed at length with the patient today.  Concerns regarding medicines are outlined above.  Orders Placed This Encounter  Procedures  . Lipid panel  . EKG 12-Lead   Meds ordered this encounter  Medications  . amLODipine (NORVASC) 10 MG tablet    Sig: Take 1 tablet (10 mg total) by mouth daily.    Dispense:  30 tablet    Refill:  5  Patient Instructions  Medication Instructions:   Your physician has recommended you make the following change in your medication:   Change your Amlodipine to 10mg  once a day by mouth.  *If you need a refill on your cardiac medications before your next appointment, please call your pharmacy*   Lab Work:  Your physician recommends that you return for a FASTING lipid profile: just prior to your 6 mos follow up.  - You will need to be fasting. Please do not have anything to eat or drink after midnight the morning you have the lab work. You may only have water or black coffee with no cream or sugar. - Please go to the [facility]. You will check in at the front desk to the right as you walk into the atrium. Valet Parking is offered if needed. - No appointment needed. You may go any day between 7 am and 6 pm.  If you have labs (blood work) drawn today and your tests are completely normal, you will receive your results only by: Marland Kitchen MyChart Message (if you have MyChart) OR . A paper copy in the mail If you have any lab test that is abnormal or we need to change your treatment, we will call you to review the results.   Testing/Procedures: None Ordered   Follow-Up: At [facility], you and your health needs are our priority.  As part of our continuing mission to provide you with exceptional heart care, we have created designated Provider Care Teams.  These Care Teams include your primary Cardiologist (physician) and Advanced  Practice Providers (APPs -  Physician Assistants and Nurse Practitioners) who all work together to provide you with the care you need, when you need it.  We recommend signing up for the patient portal called "MyChart".  Sign up information is provided on this After Visit Summary.  MyChart is used to connect with patients for Virtual Visits (Telemedicine).  Patients are able to view lab/test results, encounter notes, upcoming appointments, etc.  Non-urgent messages can be sent to your provider as well.   To learn more about what you can do with MyChart, go to [email or web address].    Your next appointment:   6 month(s)  The format for your next appointment:   In Person  Provider:   Kate Sable, MD   Other Instructions      Signed, Kate Sable, MD  03/20/2020 12:41 PM    [place]

## 2020-05-14 ENCOUNTER — Other Ambulatory Visit: Payer: Self-pay | Admitting: Family Medicine

## 2020-05-14 DIAGNOSIS — I10 Essential (primary) hypertension: Secondary | ICD-10-CM

## 2020-05-14 DIAGNOSIS — E785 Hyperlipidemia, unspecified: Secondary | ICD-10-CM

## 2020-05-14 DIAGNOSIS — Z125 Encounter for screening for malignant neoplasm of prostate: Secondary | ICD-10-CM

## 2020-05-14 DIAGNOSIS — Z1159 Encounter for screening for other viral diseases: Secondary | ICD-10-CM

## 2020-05-15 ENCOUNTER — Other Ambulatory Visit (INDEPENDENT_AMBULATORY_CARE_PROVIDER_SITE_OTHER): Payer: BC Managed Care – PPO

## 2020-05-15 ENCOUNTER — Other Ambulatory Visit: Payer: Self-pay

## 2020-05-15 DIAGNOSIS — I10 Essential (primary) hypertension: Secondary | ICD-10-CM

## 2020-05-15 DIAGNOSIS — Z1159 Encounter for screening for other viral diseases: Secondary | ICD-10-CM | POA: Diagnosis not present

## 2020-05-15 DIAGNOSIS — E785 Hyperlipidemia, unspecified: Secondary | ICD-10-CM | POA: Diagnosis not present

## 2020-05-15 DIAGNOSIS — Z125 Encounter for screening for malignant neoplasm of prostate: Secondary | ICD-10-CM | POA: Diagnosis not present

## 2020-05-15 LAB — COMPREHENSIVE METABOLIC PANEL
ALT: 24 U/L (ref 0–53)
AST: 23 U/L (ref 0–37)
Albumin: 4.3 g/dL (ref 3.5–5.2)
Alkaline Phosphatase: 49 U/L (ref 39–117)
BUN: 12 mg/dL (ref 6–23)
CO2: 27 mEq/L (ref 19–32)
Calcium: 9.6 mg/dL (ref 8.4–10.5)
Chloride: 103 mEq/L (ref 96–112)
Creatinine, Ser: 0.88 mg/dL (ref 0.40–1.50)
GFR: 94.65 mL/min (ref >60.00)
Glucose, Bld: 99 mg/dL (ref 70–99)
Potassium: 4 mEq/L (ref 3.5–5.1)
Sodium: 138 mEq/L (ref 135–145)
Total Bilirubin: 0.3 mg/dL (ref 0.2–1.2)
Total Protein: 7.3 g/dL (ref 6.0–8.3)

## 2020-05-15 LAB — MICROALBUMIN / CREATININE URINE RATIO
Creatinine,U: 180.3 mg/dL
Microalb Creat Ratio: 0.9 mg/g (ref 0.0–30.0)
Microalb, Ur: 1.7 mg/dL (ref 0.0–1.9)

## 2020-05-15 LAB — LIPID PANEL
Cholesterol: 173 mg/dL (ref 0–200)
HDL: 47.7 mg/dL (ref >39.00)
NonHDL: 125.08
Total CHOL/HDL Ratio: 4
Triglycerides: 284 mg/dL — ABNORMAL HIGH (ref 0.0–149.0)
VLDL: 56.8 mg/dL — ABNORMAL HIGH (ref 0.0–40.0)

## 2020-05-15 LAB — LDL CHOLESTEROL, DIRECT: Direct LDL: 87 mg/dL

## 2020-05-15 LAB — PSA: PSA: 0.82 ng/mL (ref 0.10–4.00)

## 2020-05-18 LAB — HEPATITIS C ANTIBODY
Hepatitis C Ab: NONREACTIVE
SIGNAL TO CUT-OFF: 0.07 (ref <1.00)

## 2020-05-22 ENCOUNTER — Other Ambulatory Visit: Payer: Self-pay

## 2020-05-22 ENCOUNTER — Encounter: Payer: Self-pay | Admitting: Family Medicine

## 2020-05-22 ENCOUNTER — Ambulatory Visit (INDEPENDENT_AMBULATORY_CARE_PROVIDER_SITE_OTHER): Payer: BC Managed Care – PPO | Admitting: Family Medicine

## 2020-05-22 VITALS — BP 136/80 | HR 78 | Temp 97.8°F | Ht 70.0 in | Wt 200.2 lb

## 2020-05-22 DIAGNOSIS — K4090 Unilateral inguinal hernia, without obstruction or gangrene, not specified as recurrent: Secondary | ICD-10-CM | POA: Diagnosis not present

## 2020-05-22 DIAGNOSIS — Z1211 Encounter for screening for malignant neoplasm of colon: Secondary | ICD-10-CM | POA: Diagnosis not present

## 2020-05-22 DIAGNOSIS — I10 Essential (primary) hypertension: Secondary | ICD-10-CM | POA: Diagnosis not present

## 2020-05-22 DIAGNOSIS — Z0000 Encounter for general adult medical examination without abnormal findings: Secondary | ICD-10-CM | POA: Diagnosis not present

## 2020-05-22 DIAGNOSIS — E785 Hyperlipidemia, unspecified: Secondary | ICD-10-CM

## 2020-05-22 DIAGNOSIS — N400 Enlarged prostate without lower urinary tract symptoms: Secondary | ICD-10-CM

## 2020-05-22 NOTE — Assessment & Plan Note (Signed)
Chronic, improved. Continue current regimen. Appreciate cards care.

## 2020-05-22 NOTE — Assessment & Plan Note (Signed)
Chronic, LDL improved on statin however triglycerides remain high. Reviewed diet choices to control levels. The 10-year ASCVD risk score Caleb Spencer Caleb Spencer., et al., 2013) is: 15.7%   Values used to calculate the score:     Age: 59 years     Sex: Male     Is Non-Hispanic African American: No     Diabetic: Yes     Tobacco smoker: No     Systolic Blood Pressure: 481 mmHg     Is BP treated: Yes     HDL Cholesterol: 47.7 mg/dL     Total Cholesterol: 173 mg/dL

## 2020-05-22 NOTE — Assessment & Plan Note (Signed)
Large - in repair discussion since 2021 but has been unable to afford hernia repair. Will let us know when ready for new referral.

## 2020-05-22 NOTE — Assessment & Plan Note (Signed)
Asxs. Noted on exam today.

## 2020-05-22 NOTE — Progress Notes (Signed)
This visit was conducted in person.  BP 136/80 (BP Location: Left Arm, Patient Position: Sitting, Cuff Size: Normal)    Pulse 78    Temp 97.8 F (36.6 C) (Temporal)    Ht 5\' 10"  (1.778 m)    Wt 200 lb 4 oz (90.8 kg)    SpO2 98%    BMI 28.73 kg/m    CC: CPE Subjective:    Patient ID: Caleb Spencer, male    DOB: 1961/03/11, 59 y.o.   MRN: [id number]  HPI: Caleb WITUCKI is a 59 y.o. male presenting on 05/22/2020 for Annual Exam   Since seen here established with Dr Garen Lah taking amlodipine 10mg  daily and atorvastatin 40mg  daily.   Preventative: Colon cancer screening - discussed, would like colonoscopy - referred  Prostate cancer screening - discussed. Would like PSA yearly, DRE intermittent Lung cancer screening - not eligible Flu shot - declines Tdap 2012 Pneumonia shot - not due Shingrix - discussed, declines COVID vaccine - declines  Ex smoker - occasional cigar  Alcohol - down to 1 beer/day, likes brandy  Seat belt use discussed Sunscreen use discussed. No changing moles on skin. Dentist - needs dental work Eye exam - due   Lives alone, no pets Occ: [employer] Activity: no regular exercise  Diet: good water, increasing fruits/vegetables      Relevant past medical, surgical, family and social history reviewed and updated as indicated. Interim medical history since our last visit reviewed. Allergies and medications reviewed and updated. Outpatient Medications Prior to Visit  Medication Sig Dispense Refill   amLODipine (NORVASC) 10 MG tablet Take 1 tablet (10 mg total) by mouth daily. 30 tablet 5   ASPIRIN 81 PO Take by mouth daily.     atorvastatin (LIPITOR) 40 MG tablet Take 1 tablet (40 mg total) by mouth daily. 30 tablet 6   Ibuprofen (MOTRIN PO) Take by mouth as needed. As needed for neck pain     Multiple Vitamin (MULTIVITAMIN PO) Take 1 tablet by mouth daily.       No facility-administered medications prior to visit.     Per  HPI unless specifically indicated in ROS section below Review of Systems  Constitutional: Negative for activity change, appetite change, chills, fatigue, fever and unexpected weight change.  HENT: Negative for hearing loss.   Eyes: Negative for visual disturbance.  Respiratory: Negative for cough, chest tightness, shortness of breath and wheezing.   Cardiovascular: Negative for chest pain, palpitations and leg swelling.  Gastrointestinal: Negative for abdominal distention, abdominal pain, blood in stool, constipation, diarrhea, nausea and vomiting.  Genitourinary: Negative for difficulty urinating and hematuria.  Musculoskeletal: Negative for arthralgias, myalgias and neck pain.  Skin: Negative for rash.  Neurological: Negative for dizziness, seizures, syncope and headaches.  Hematological: Negative for adenopathy. Does not bruise/bleed easily.  Psychiatric/Behavioral: Negative for dysphoric mood. The patient is not nervous/anxious.    Objective:  BP 136/80 (BP Location: Left Arm, Patient Position: Sitting, Cuff Size: Normal)    Pulse 78    Temp 97.8 F (36.6 C) (Temporal)    Ht 5\' 10"  (1.778 m)    Wt 200 lb 4 oz (90.8 kg)    SpO2 98%    BMI 28.73 kg/m   Wt Readings from Last 3 Encounters:  05/22/20 200 lb 4 oz (90.8 kg)  03/20/20 204 lb 2 oz (92.6 kg)  02/17/20 202 lb (91.6 kg)      Physical Exam Vitals and nursing note reviewed.  Constitutional:      General: He is not in acute distress.    Appearance: Normal appearance. He is well-developed. He is not ill-appearing.  HENT:     Head: Normocephalic and atraumatic.     Right Ear: Hearing, tympanic membrane, ear canal and external ear normal.     Left Ear: Hearing, tympanic membrane, ear canal and external ear normal.  Eyes:     General: No scleral icterus.    Extraocular Movements: Extraocular movements intact.     Conjunctiva/sclera: Conjunctivae normal.     Pupils: Pupils are equal, round, and reactive to light.  Neck:      Thyroid: No thyroid mass or thyromegaly.     Vascular: No carotid bruit.  Cardiovascular:     Rate and Rhythm: Normal rate and regular rhythm.     Pulses: Normal pulses.          Radial pulses are 2+ on the right side and 2+ on the left side.     Heart sounds: Normal heart sounds. No murmur heard.   Pulmonary:     Effort: Pulmonary effort is normal. No respiratory distress.     Breath sounds: Normal breath sounds. No wheezing, rhonchi or rales.  Abdominal:     General: Abdomen is flat. Bowel sounds are normal. There is no distension.     Palpations: Abdomen is soft. There is no mass.     Tenderness: There is no abdominal tenderness. There is no guarding or rebound.     Hernia: A hernia is present. Hernia is present in the right inguinal area (large). There is no hernia in the left inguinal area.  Genitourinary:    Penis: Normal.      Testes:        Right: Swelling present. Mass or tenderness not present.        Left: Mass, tenderness or swelling not present.     Prostate: Enlarged (30gm). Not tender and no nodules present.     Rectum: Normal. No mass, tenderness, anal fissure, external hemorrhoid or internal hemorrhoid. Normal anal tone.  Musculoskeletal:        General: Normal range of motion.     Cervical back: Normal range of motion and neck supple.     Right lower leg: No edema.     Left lower leg: No edema.  Lymphadenopathy:     Cervical: No cervical adenopathy.     Lower Body: No right inguinal adenopathy. No left inguinal adenopathy.  Skin:    General: Skin is warm and dry.     Findings: No rash.  Neurological:     General: No focal deficit present.     Mental Status: He is alert and oriented to person, place, and time.     Comments: CN grossly intact, station and gait intact  Psychiatric:        Mood and Affect: Mood normal.        Behavior: Behavior normal.        Thought Content: Thought content normal.        Judgment: Judgment normal.       Results for orders  placed or performed in visit on 05/15/20  Hepatitis C antibody  Result Value Ref Range   Hepatitis C Ab NON-REACTIVE NON-REACTI   SIGNAL TO CUT-OFF 0.07 <1.00  PSA  Result Value Ref Range   PSA 0.82 0.10 - 4.00 ng/mL  Microalbumin / creatinine urine ratio  Result Value Ref Range   Microalb, Ur 1.7  0.0 - 1.9 mg/dL   Creatinine,U 180.3 mg/dL   Microalb Creat Ratio 0.9 0.0 - 30.0 mg/g  Comprehensive metabolic panel  Result Value Ref Range   Sodium 138 135 - 145 mEq/L   Potassium 4.0 3.5 - 5.1 mEq/L   Chloride 103 96 - 112 mEq/L   CO2 27 19 - 32 mEq/L   Glucose, Bld 99 70 - 99 mg/dL   BUN 12 6 - 23 mg/dL   Creatinine, Ser 0.88 0.40 - 1.50 mg/dL   Total Bilirubin 0.3 0.2 - 1.2 mg/dL   Alkaline Phosphatase 49 39 - 117 U/L   AST 23 0 - 37 U/L   ALT 24 0 - 53 U/L   Total Protein 7.3 6.0 - 8.3 g/dL   Albumin 4.3 3.5 - 5.2 g/dL   GFR 94.65 >60.00 mL/min   Calcium 9.6 8.4 - 10.5 mg/dL  Lipid panel  Result Value Ref Range   Cholesterol 173 0 - 200 mg/dL   Triglycerides 284.0 (H) 0 - 149 mg/dL   HDL 47.70 >39.00 mg/dL   VLDL 56.8 (H) 0.0 - 40.0 mg/dL   Total CHOL/HDL Ratio 4    NonHDL 125.08   LDL cholesterol, direct  Result Value Ref Range   Direct LDL 87.0 mg/dL   Assessment & Plan:  This visit occurred during the SARS-CoV-2 public health emergency.  Safety protocols were in place, including screening questions prior to the visit, additional usage of staff PPE, and extensive cleaning of exam room while observing appropriate contact time as indicated for disinfecting solutions.   Problem List Items Addressed This Visit    Right inguinal hernia    Large - in repair discussion since 2021 but has been unable to afford hernia repair. Will let us know when ready for new referral.       Hypertension    Chronic, improved. Continue current regimen. Appreciate cards care.       Hyperlipidemia    Chronic, LDL improved on statin however triglycerides remain high. Reviewed diet choices  to control levels. The 10-year ASCVD risk score Mikey Bussing DC Brooke Bonito., et al., 2013) is: 15.7%   Values used to calculate the score:     Age: 75 years     Sex: Male     Is Non-Hispanic African American: No     Diabetic: Yes     Tobacco smoker: No     Systolic Blood Pressure: 196 mmHg     Is BP treated: Yes     HDL Cholesterol: 47.7 mg/dL     Total Cholesterol: 173 mg/dL       Healthcare maintenance - Primary    Preventative protocols reviewed and updated unless pt declined. Discussed healthy diet and lifestyle.       BPH (benign prostatic hyperplasia)    Asxs. Noted on exam today.        Other Visit Diagnoses    Special screening for malignant neoplasms, colon       Relevant Orders   Ambulatory referral to Gastroenterology       No orders of the defined types were placed in this encounter.  Orders Placed This Encounter  Procedures   Ambulatory referral to Gastroenterology    Referral Priority:   Routine    Referral Type:   Consultation    Referral Reason:   Specialty Services Required    Number of Visits Requested:   1   Patient instructions: Consider COVID vaccine  Consider shingles vaccine  Let me know when  able to see surgeon for right inguinal hernia repair.  Return in 6 months for blood pressure follow up visit.   Follow up plan: Return in about 6 months (around 11/19/2020) for follow up visit.  Ria Bush, MD

## 2020-05-22 NOTE — Assessment & Plan Note (Signed)
Preventative protocols reviewed and updated unless pt declined. Discussed healthy diet and lifestyle.  

## 2020-05-22 NOTE — Patient Instructions (Addendum)
Consider COVID vaccine  Consider shingles vaccine  Let me know when able to see surgeon for right inguinal hernia repair.  Return in 6 months for blood pressure follow up visit.   Inguinal Hernia, Adult An inguinal hernia is when fat or your intestines push through a weak spot in a muscle where your leg meets your lower belly (groin). This causes a rounded lump (bulge). This kind of hernia could also be:  In your scrotum, if you are male.  In folds of skin around your vagina, if you are male. There are three types of inguinal hernias. These include:  Hernias that can be pushed back into the belly (are reducible). This type rarely causes pain.  Hernias that cannot be pushed back into the belly (are incarcerated).  Hernias that cannot be pushed back into the belly and lose their blood supply (are strangulated). This type needs emergency surgery. If you do not have symptoms, you may not need treatment. If you have symptoms or a large hernia, you may need surgery. Follow these instructions at home: Lifestyle  Do these things if told by your doctor so you do not have trouble pooping (constipation): ? Drink enough fluid to keep your pee (urine) pale yellow. ? Eat foods that have a lot of fiber. These include fresh fruits and vegetables, whole grains, and beans. ? Limit foods that are high in fat and processed sugars. These include foods that are fried or sweet. ? Take medicine for trouble pooping.  Avoid lifting heavy objects.  Avoid standing for long amounts of time.  Do not use any products that contain nicotine or tobacco. These include cigarettes and e-cigarettes. If you need help quitting, ask your doctor.  Stay at a healthy weight. General instructions  You may try to push your hernia in by very gently pressing on it when you are lying down. Do not try to force the bulge back in if it will not push in easily.  Watch your hernia for any changes in shape, size, or color. Tell  your doctor if you see any changes.  Take over-the-counter and prescription medicines only as told by your doctor.  Keep all follow-up visits as told by your doctor. This is important. Contact a doctor if:  You have a fever.  You have new symptoms.  Your symptoms get worse. Get help right away if:  The area where your leg meets your lower belly has: ? Pain that gets worse suddenly. ? A bulge that gets bigger suddenly, and it does not get smaller after that. ? A bulge that turns red or purple. ? A bulge that is painful when you touch it.  You are a man, and your scrotum: ? Suddenly feels painful. ? Suddenly changes in size.  You cannot push the hernia in by very gently pressing on it when you are lying down. Do not try to force the bulge back in if it will not push in easily.  You feel sick to your stomach (nauseous), and that feeling does not go away.  You throw up (vomit), and that keeps happening.  You have a fast heartbeat.  You cannot poop (have a bowel movement) or pass gas. These symptoms may be an emergency. Do not wait to see if the symptoms will go away. Get medical help right away. Call your local emergency services (911 in the [place] Summary  An inguinal hernia is when fat or your intestines push through a weak spot in a muscle where  your leg meets your lower belly (groin). This causes a rounded lump (bulge).  If you do not have symptoms, you may not need treatment. If you have symptoms or a large hernia, you may need surgery.  Avoid lifting heavy objects. Also avoid standing for long amounts of time.  Do not try to force the bulge back in if it will not push in easily. This information is not intended to replace advice given to you by your health care provider. Make sure you discuss any questions you have with your health care provider. Document Revised: 08/05/2017 Document Reviewed: 04/05/2017 Elsevier Patient Education  [place]  Maintenance, Male Adopting a healthy lifestyle and getting preventive care are important in promoting health and wellness. Ask your health care provider about:  The right schedule for you to have regular tests and exams.  Things you can do on your own to prevent diseases and keep yourself healthy. What should I know about diet, weight, and exercise? Eat a healthy diet   Eat a diet that includes plenty of vegetables, fruits, low-fat dairy products, and lean protein.  Do not eat a lot of foods that are high in solid fats, added sugars, or sodium. Maintain a healthy weight Body mass index (BMI) is a measurement that can be used to identify possible weight problems. It estimates body fat based on height and weight. Your health care provider can help determine your BMI and help you achieve or maintain a healthy weight. Get regular exercise Get regular exercise. This is one of the most important things you can do for your health. Most adults should:  Exercise for at least 150 minutes each week. The exercise should increase your heart rate and make you sweat (moderate-intensity exercise).  Do strengthening exercises at least twice a week. This is in addition to the moderate-intensity exercise.  Spend less time sitting. Even light physical activity can be beneficial. Watch cholesterol and blood lipids Have your blood tested for lipids and cholesterol at 58 years of age, then have this test every 5 years. You may need to have your cholesterol levels checked more often if:  Your lipid or cholesterol levels are high.  You are older than 58 years of age.  You are at high risk for heart disease. What should I know about cancer screening? Many types of cancers can be detected early and may often be prevented. Depending on your health history and family history, you may need to have cancer screening at various ages. This may include screening for:  Colorectal cancer.  Prostate cancer.  Skin  cancer.  Lung cancer. What should I know about heart disease, diabetes, and high blood pressure? Blood pressure and heart disease  High blood pressure causes heart disease and increases the risk of stroke. This is more likely to develop in people who have high blood pressure readings, are of African descent, or are overweight.  Talk with your health care provider about your target blood pressure readings.  Have your blood pressure checked: ? Every 3-5 years if you are 71-54 years of age. ? Every year if you are 39 years old or older.  If you are between the ages of 77 and 1 and are a current or former smoker, ask your health care provider if you should have a one-time screening for abdominal aortic aneurysm (AAA). Diabetes Have regular diabetes screenings. This checks your fasting blood sugar level. Have the screening done:  Once every three years after  age 11 if you are at a normal weight and have a low risk for diabetes.  More often and at a younger age if you are overweight or have a high risk for diabetes. What should I know about preventing infection? Hepatitis B If you have a higher risk for hepatitis B, you should be screened for this virus. Talk with your health care provider to find out if you are at risk for hepatitis B infection. Hepatitis C Blood testing is recommended for:  Everyone born from [birth year] through 1964.  Anyone with known risk factors for hepatitis C. Sexually transmitted infections (STIs)  You should be screened each year for STIs, including gonorrhea and chlamydia, if: ? You are sexually active and are younger than 58 years of age. ? You are older than 58 years of age and your health care provider tells you that you are at risk for this type of infection. ? Your sexual activity has changed since you were last screened, and you are at increased risk for chlamydia or gonorrhea. Ask your health care provider if you are at risk.  Ask your health care provider  about whether you are at high risk for HIV. Your health care provider may recommend a prescription medicine to help prevent HIV infection. If you choose to take medicine to prevent HIV, you should first get tested for HIV. You should then be tested every 3 months for as long as you are taking the medicine. Follow these instructions at home: Lifestyle  Do not use any products that contain nicotine or tobacco, such as cigarettes, e-cigarettes, and chewing tobacco. If you need help quitting, ask your health care provider.  Do not use street drugs.  Do not share needles.  Ask your health care provider for help if you need support or information about quitting drugs. Alcohol use  Do not drink alcohol if your health care provider tells you not to drink.  If you drink alcohol: ? Limit how much you have to 0-2 drinks a day. ? Be aware of how much alcohol is in your drink. In the [place] one drink equals one 12 oz bottle of beer (355 mL), one 5 oz glass of wine (148 mL), or one 1 oz glass of hard liquor (44 mL). General instructions  Schedule regular health, dental, and eye exams.  Stay current with your vaccines.  Tell your health care provider if: ? You often feel depressed. ? You have ever been abused or do not feel safe at home. Summary  Adopting a healthy lifestyle and getting preventive care are important in promoting health and wellness.  Follow your health care provider's instructions about healthy diet, exercising, and getting tested or screened for diseases.  Follow your health care provider's instructions on monitoring your cholesterol and blood pressure. This information is not intended to replace advice given to you by your health care provider. Make sure you discuss any questions you have with your health care provider. Document Revised: 06/27/2018 Document Reviewed: 06/27/2018 Elsevier Patient Education  2020 [employer].

## 2020-06-02 ENCOUNTER — Telehealth: Payer: Self-pay

## 2020-06-02 ENCOUNTER — Telehealth: Payer: BC Managed Care – PPO

## 2020-06-02 NOTE — Telephone Encounter (Signed)
Unable to contact patient.  No voicemail set up.  Will ask front office to reschedule his 9 am telephone triage if he calls back.  Thanks,  [place], [place]

## 2020-06-02 NOTE — Telephone Encounter (Signed)
Attempted to contact patient to complete triage and schedule colonoscopy.  I was unable to reach him.  No voicemail is available to leave voice message.  Thanks,  [place], [place]

## 2020-09-03 ENCOUNTER — Other Ambulatory Visit: Payer: Self-pay | Admitting: Family Medicine

## 2020-10-01 NOTE — Progress Notes (Signed)
Office Visit    Patient Name: Caleb Spencer Date of Encounter: 10/02/2020  PCP:  Ria Bush, [facility]  Cardiologist:  Kate Sable, MD  Advanced Practice Provider:  No care team member to display Electrophysiologist:  None    Chief Complaint    Chief Complaint  Patient presents with  . 6 month follow up     "doing well." Medications reviewed by the patient verbally.     60 year old male with history of hypertension, hyperlipidemia, and who presents today for 42-month follow-up.  Past Medical History    Past Medical History:  Diagnosis Date  . History of smoking   . Kidney stone   . Left groin hernia    working on finances to be able to do   Past Surgical History:  Procedure Laterality Date  . INGUINAL HERNIA REPAIR  1987   Left    Allergies  No Known Allergies  History of Present Illness    ABHISHEK Spencer is a 60 y.o. male with PMH as above.  He has history of hypertension and hyperlipidemia.  He reports hypertension on his mother side.  No family history of early cardiac death before the age of 40.   Echo showed normal LV SF, aortic root dilation.  When last seen in office, he was working on reducing alcohol intake, drinking 3 beers daily.  He had stopped all tobacco products.  BP was well controlled on amlodipine with home pressures SBP 120s.  Today, 10/02/2020, he returns to clinic in BP noted to be borderline at 130/80.  At home, he reports labile blood pressure with SBP 100-1 38 and DBP 70-72.  He is currently checking his blood pressure after work and at the end of each day.  He wonders if his blood pressure checks are influenced by the stress of his job.  He works as a Nature conservation officer.  He notices occasional heart racing or fluttering.  Frequency currently reported as twice per year, lasting only a few seconds.  No chest pain or shortness of breath at rest.  He does report dyspnea with heavy activity, attributed  to aging and deconditioning.  No presyncope or syncope.  No lower extremity edema, abdominal distention, or orthopnea.  He continues to drink 3-4 beers daily.  He is not using tobacco products.  He is working to reduce salt in his diet and eat more healthy.  He reports sleeping 6 hours each night.  Since starting atorvastatin, he has noted bilateral hand pain.  He wonders if the medication is contributing with recommendations as below.  Home Medications    Current Outpatient Medications on File Prior to Visit  Medication Sig Dispense Refill  . amLODipine (NORVASC) 10 MG tablet Take 1 tablet (10 mg total) by mouth daily. 30 tablet 5  . ASPIRIN 81 PO Take by mouth daily.    . Ibuprofen (MOTRIN PO) Take by mouth as needed. As needed for neck pain    . Multiple Vitamin (MULTIVITAMIN PO) Take 1 tablet by mouth daily.     No current facility-administered medications on file prior to visit.    Review of Systems    He reports dyspnea with heavy activity, bilateral hand pain since starting atorvastatin, and racing heart rate/tachypalpitations that occurred twice per year. He denies chest pain,pnd, orthopnea, n, v, dizziness, syncope, edema, weight gain, or early satiety.   All other systems reviewed and are otherwise negative except as noted above.  Physical Exam    VS:  BP 130/80 (BP Location: Left Arm, Patient Position: Sitting, Cuff Size: Normal)   Pulse 72   Ht 5\' 11"  (1.803 m)   Wt 190 lb 4 oz (86.3 kg)   SpO2 98%   BMI 26.53 kg/m  , BMI Body mass index is 26.53 kg/m. GEN: Well nourished, well developed, in no acute distress. HEENT: normal. Neck: Supple, no JVD, carotid bruits, or masses. Cardiac: RRR, no murmurs, rubs, or gallops. No clubbing, cyanosis, edema.  Radials/DP/PT 2+ and equal bilaterally.  Respiratory:  Respirations regular and unlabored, clear to auscultation bilaterally. GI: Soft, nontender, nondistended, BS + x 4. MS: no deformity or atrophy. Skin: warm and dry, no  rash. Neuro:  Strength and sensation are intact. Psych: Normal affect.  Accessory Clinical Findings    ECG personally reviewed by me today -NSR, 72 bpm, LVH and IVCD with QRS 157ms, ST depression and T wave inversion as seen in prior EKGs persists in leads II, 3, aVF, changes also noted V4, V5 is seen in prior EKGs, IVCD, repolarization changes- no acute changes.  VITALS Reviewed today   Temp Readings from Last 3 Encounters:  05/22/20 97.8 F (36.6 C) (Temporal)  02/14/20 (!) 97.4 F (36.3 C) (Temporal)  01/31/20 98 F (36.7 C) (Temporal)   BP Readings from Last 3 Encounters:  10/02/20 130/80  05/22/20 136/80  03/20/20 120/72   Pulse Readings from Last 3 Encounters:  10/02/20 72  05/22/20 78  03/20/20 61    Wt Readings from Last 3 Encounters:  10/02/20 190 lb 4 oz (86.3 kg)  05/22/20 200 lb 4 oz (90.8 kg)  03/20/20 204 lb 2 oz (92.6 kg)     LABS  reviewed today   Lab Results  Component Value Date   WBC 9.6 01/31/2020   HGB 16.3 01/31/2020   HCT 47.9 01/31/2020   MCV 98.1 01/31/2020   PLT 203.0 01/31/2020   Lab Results  Component Value Date   CREATININE 0.88 05/15/2020   BUN 12 05/15/2020   NA 138 05/15/2020   K 4.0 05/15/2020   CL 103 05/15/2020   CO2 27 05/15/2020   Lab Results  Component Value Date   ALT 24 05/15/2020   AST 23 05/15/2020   ALKPHOS 49 05/15/2020   BILITOT 0.3 05/15/2020   Lab Results  Component Value Date   CHOL 173 05/15/2020   HDL 47.70 05/15/2020   LDLDIRECT 87.0 05/15/2020   TRIG 284.0 (H) 05/15/2020   CHOLHDL 4 05/15/2020    No results found for: HGBA1C Lab Results  Component Value Date   TSH 3.25 01/31/2020     STUDIES/PROCEDURES reviewed today   Echo 01/2020 1. Left ventricular ejection fraction, by estimation, is 60 to 65%. The  left ventricle has normal function. The left ventricle has no regional  wall motion abnormalities. The left ventricular internal cavity size was  mildly dilated. Left ventricular   diastolic parameters were normal.  2. Right ventricular systolic function is normal. The right ventricular  size is normal. Tricuspid regurgitation signal is inadequate for assessing  PA pressure.  3. The aortic valve is tricuspid. Aortic valve regurgitation is mild.  4. There is borderline dilatation of the aortic root measuring 36 mm.  Ascending aorta 2.8 cm  5. The inferior vena cava is dilated in size with >50% respiratory  variability, suggesting right atrial pressure of 8 mmHg.   Assessment & Plan    Abnormal EKG --History of abnormal EKG with today's  tracing without acute changes from that of previous.  No chest pain or shortness of breath at rest.  He does report dyspnea with strenuous activity, which he attributes to age.  Discussed that deconditioning could also contribute.  Euvolemic on exam.  Previous echo as above with normal EF and NR WMA.  Risk factors for cardiac ischemia include male, age greater than 95, hypertension, hyperlipidemia, and history of tobacco use. Given history of abnormal EKG, recommend further ischemic work-up with MPI versus coronary CT after echo and if no acute structural abnormalities.  He is aware to call the office if any chest pain or worsening shortness of breath.  Tachypalpitations --Discussed cardiac monitoring today with decision to defer at this time, given tachypalpitations currently occur only twice per year.  He will call the office if these worsen in frequency. Will repeat an echo as above.  EKG without ectopy.  Essential hypertension  -BP borderline today at 130/80.  Patient reports lower BP at home with systolics sometimes in the low 100s.  Reviewed recommendations for low-salt diet, fluid restriction.  Suspect labile blood pressure may at least in part be due to salt intake. Also discussed proper BP measurement techniques.  Discussed avoiding NSAIDs and reducing alcohol intake.  Reviewed lifestyle changes, including diet and increased  activity.  For now, continue current amlodipine 10 mg daily.  Recheck BP at RTC.  Recommended he monitor his BP at home with goal blood pressure 130/80 or lower.  Aortic root dilation --Given history of aortic root dilation at 36 mm on 01/2020. We will repeat an echo.  Reviewed recommendations to avoid fluoroquinolones and heavy lifting, as well is control heart rate, blood pressure, and cholesterol.  Recommendations provided on AVS as well.  Continue to monitor.  HLD HLD --04/2020 LDL 87.0 and above goal of below 70.  Triglycerides also elevated at 284.0.  Based on his reported hand pain today and most recent labs, will discontinue atorvastatin 40 mg daily and start Crestor 40 mg daily.  Recheck lipid and liver function in 6 to 8 weeks.  If LDL still not well controlled at that time, consider Zetia.  If triglycerides remain elevated on recheck, consider Vascepa.  Will defer for now.  Hand pain --As above, will switch from atorvastatin to Crestor to see if this alleviates his hand pain.  He will call the office and keep [place] updated.  Alcohol use --Discussed the effect of alcohol on the heart with recommendation for complete cessation.  He currently drinks 3-4 beers per night.  History of tobacco use --Ongoing cessation recommended.  Medication changes: Stop atorvastatin.  Start Crestor 40 mg daily. Labs ordered: None Studies / Imaging ordered: Echo Future considerations: ?MPI, recheck lipid and liver function in 6 to 8 weeks Disposition: RTC after echo Arvil Chaco, PA-C 10/02/2020

## 2020-10-02 ENCOUNTER — Ambulatory Visit (INDEPENDENT_AMBULATORY_CARE_PROVIDER_SITE_OTHER): Payer: BC Managed Care – PPO | Admitting: Physician Assistant

## 2020-10-02 ENCOUNTER — Other Ambulatory Visit: Payer: Self-pay

## 2020-10-02 ENCOUNTER — Encounter: Payer: Self-pay | Admitting: Physician Assistant

## 2020-10-02 ENCOUNTER — Telehealth: Payer: Self-pay | Admitting: Licensed Clinical Social Worker

## 2020-10-02 VITALS — BP 130/80 | HR 72 | Ht 71.0 in | Wt 190.2 lb

## 2020-10-02 DIAGNOSIS — Z87891 Personal history of nicotine dependence: Secondary | ICD-10-CM

## 2020-10-02 DIAGNOSIS — Z7289 Other problems related to lifestyle: Secondary | ICD-10-CM | POA: Diagnosis not present

## 2020-10-02 DIAGNOSIS — R0600 Dyspnea, unspecified: Secondary | ICD-10-CM

## 2020-10-02 DIAGNOSIS — E7800 Pure hypercholesterolemia, unspecified: Secondary | ICD-10-CM | POA: Diagnosis not present

## 2020-10-02 DIAGNOSIS — I10 Essential (primary) hypertension: Secondary | ICD-10-CM

## 2020-10-02 DIAGNOSIS — R9431 Abnormal electrocardiogram [ECG] [EKG]: Secondary | ICD-10-CM | POA: Diagnosis not present

## 2020-10-02 DIAGNOSIS — I77810 Thoracic aortic ectasia: Secondary | ICD-10-CM

## 2020-10-02 DIAGNOSIS — Z789 Other specified health status: Secondary | ICD-10-CM

## 2020-10-02 MED ORDER — ROSUVASTATIN CALCIUM 40 MG PO TABS: 40.0000 mg | ORAL_TABLET | Freq: Every day | ORAL | 3 refills | Status: DC

## 2020-10-02 NOTE — Patient Instructions (Addendum)
Medication Instructions:  Your physician has recommended you make the following change in your medication:   STOP Atorvastatin  START Rosuvastatin (Crestor) 40mg  DAILY  *If you need a refill on your cardiac medications before your next appointment, please call your pharmacy*   Lab Work: None ordered   Testing/Procedures: Your physician has requested that you have an echocardiogram. Echocardiography is a painless test that uses sound waves to create images of your heart. It provides your doctor with information about the size and shape of your heart and how well your heart's chambers and valves are working. This procedure takes approximately one hour. There are no restrictions for this procedure.   Follow-Up: At [facility], you and your health needs are our priority.  As part of our continuing mission to provide you with exceptional heart care, we have created designated Provider Care Teams.  These Care Teams include your primary Cardiologist (physician) and Advanced Practice Providers (APPs -  Physician Assistants and Nurse Practitioners) who all work together to provide you with the care you need, when you need it.  We recommend signing up for the patient portal called "MyChart".  Sign up information is provided on this After Visit Summary.  MyChart is used to connect with patients for Virtual Visits (Telemedicine).  Patients are able to view lab/test results, encounter notes, upcoming appointments, etc.  Non-urgent messages can be sent to your provider as well.   To learn more about what you can do with MyChart, go to [email or web address].    Your next appointment:   1 month(s)  The format for your next appointment:   In Person  Provider:   You may see Kate Sable, MD or one of the following Advanced Practice Providers on your designated Care Team:     Marrianne Mood, PA-C   Other Instructions  Please let our office know if changing to Crestor helps your joint  pain.   Mainstays of therapy for aneurysms include good blood pressure control, healthy lifestyle, and avoiding fluoroquinolone antibiotic medications (such as those in the "Cipro" class, ending in "floxacin") due to risk of damage to the aorta. This is a finding to be monitored periodically by their primary cardiologist. Since aneurysms can run in families, you should discuss your diagnosis with first degree relatives as they may need to be screened for this. Regular mild-moderate physical exercise is OK but avoid heavy lifting/weight lifting over 30lbs, chopping wood, shoveling snow or digging heavy earth with a shovel.  Call the office if your racing heart rate increases in frequency, as we can perform cardiac monitoring at that time.  Call the office if any chest pain or worsening shortness of breath.  Please monitor your blood pressure at home. Call your doctor if you tend to get readings of greater than 130 on the top number or 80 on the bottom number. Goal BP is 130/80.

## 2020-10-02 NOTE — Telephone Encounter (Signed)
CSW referred to assist patient with obtaining a BP cuff. CSW contacted patient to inform cuff will be delivered to home. Patient grateful for support and assistance. CSW available as needed. Jackie Brennan, LCSW, CCSW-MCS [phone]  

## 2020-10-23 ENCOUNTER — Other Ambulatory Visit: Payer: Self-pay

## 2020-10-23 ENCOUNTER — Ambulatory Visit (INDEPENDENT_AMBULATORY_CARE_PROVIDER_SITE_OTHER): Payer: BC Managed Care – PPO

## 2020-10-23 DIAGNOSIS — R0600 Dyspnea, unspecified: Secondary | ICD-10-CM | POA: Diagnosis not present

## 2020-10-23 DIAGNOSIS — I77810 Thoracic aortic ectasia: Secondary | ICD-10-CM | POA: Diagnosis not present

## 2020-10-23 LAB — ECHOCARDIOGRAM COMPLETE
AR max vel: 3.91 cm2
AV Area VTI: 3.84 cm2
AV Area mean vel: 3.68 cm2
AV Mean grad: 4 mmHg
AV Peak grad: 6.7 mmHg
Ao pk vel: 1.29 m/s
Area-P 1/2: 2.99 cm2
Calc EF: 38.5 %
P 1/2 time: 485 msec
S' Lateral: 5.1 cm
Single Plane A2C EF: 39.8 %
Single Plane A4C EF: 38.3 %

## 2020-10-27 ENCOUNTER — Telehealth: Payer: Self-pay | Admitting: *Deleted

## 2020-10-27 NOTE — Telephone Encounter (Signed)
Attempted to call pt to review echo results.  No answer. Unable to leave vm.  Pt has follow up scheduled 4/22.

## 2020-10-27 NOTE — Telephone Encounter (Signed)
Patient returning call for echo results. 

## 2020-10-27 NOTE — Telephone Encounter (Signed)
-----   Message from Arvil Chaco, PA-C sent at 10/25/2020  7:58 PM EDT ----- Echo shows reduced pump function from that of previous echo. The walls of the heart are not squeezing as well as they should, and they are also moderately stiff or slow to relax. There is a mildly leaky mitral valve and mild to moderately leaky aortic valve that is slightly thickened, which we can continue to monitor.   Given the reduced pump function, further workup is recommended to determine the reason for reduced EF, and ensure it is not due to blockages. We can discuss the options for further workup at RTC.  Aggressive risk factor modification recommended. Continue ASA and statin.

## 2020-10-28 NOTE — Telephone Encounter (Signed)
Patient returning call to go over results. Patient working please try again after 12 .

## 2020-10-28 NOTE — Telephone Encounter (Signed)
Attempted to call pt. No answer. Unable to leave vm

## 2020-10-28 NOTE — Telephone Encounter (Signed)
I spoke with the patient regarding his echo results and Jacquelyn's recommendations to: - continue ASA/ statin - keep his scheduled follow up appointment on 4/22.  The patient voices understanding and is agreeable.

## 2020-11-06 ENCOUNTER — Ambulatory Visit: Payer: BC Managed Care – PPO | Admitting: Physician Assistant

## 2020-11-20 ENCOUNTER — Other Ambulatory Visit: Payer: Self-pay

## 2020-11-20 ENCOUNTER — Ambulatory Visit (INDEPENDENT_AMBULATORY_CARE_PROVIDER_SITE_OTHER): Payer: BC Managed Care – PPO | Admitting: Physician Assistant

## 2020-11-20 ENCOUNTER — Ambulatory Visit (INDEPENDENT_AMBULATORY_CARE_PROVIDER_SITE_OTHER): Payer: BC Managed Care – PPO | Admitting: Family Medicine

## 2020-11-20 ENCOUNTER — Encounter: Payer: Self-pay | Admitting: Physician Assistant

## 2020-11-20 ENCOUNTER — Encounter: Payer: Self-pay | Admitting: Family Medicine

## 2020-11-20 VITALS — BP 136/78 | HR 66 | Temp 98.3°F | Ht 71.0 in | Wt 194.1 lb

## 2020-11-20 VITALS — BP 134/80 | HR 66 | Ht 71.0 in | Wt 194.0 lb

## 2020-11-20 DIAGNOSIS — I5020 Unspecified systolic (congestive) heart failure: Secondary | ICD-10-CM

## 2020-11-20 DIAGNOSIS — Z789 Other specified health status: Secondary | ICD-10-CM

## 2020-11-20 DIAGNOSIS — E785 Hyperlipidemia, unspecified: Secondary | ICD-10-CM

## 2020-11-20 DIAGNOSIS — I77810 Thoracic aortic ectasia: Secondary | ICD-10-CM

## 2020-11-20 DIAGNOSIS — I10 Essential (primary) hypertension: Secondary | ICD-10-CM

## 2020-11-20 DIAGNOSIS — R072 Precordial pain: Secondary | ICD-10-CM

## 2020-11-20 DIAGNOSIS — Z87891 Personal history of nicotine dependence: Secondary | ICD-10-CM

## 2020-11-20 DIAGNOSIS — E781 Pure hyperglyceridemia: Secondary | ICD-10-CM | POA: Diagnosis not present

## 2020-11-20 DIAGNOSIS — Z7289 Other problems related to lifestyle: Secondary | ICD-10-CM

## 2020-11-20 DIAGNOSIS — I38 Endocarditis, valve unspecified: Secondary | ICD-10-CM | POA: Diagnosis not present

## 2020-11-20 DIAGNOSIS — R9431 Abnormal electrocardiogram [ECG] [EKG]: Secondary | ICD-10-CM

## 2020-11-20 DIAGNOSIS — R0600 Dyspnea, unspecified: Secondary | ICD-10-CM

## 2020-11-20 MED ORDER — ISOSORBIDE MONONITRATE ER 30 MG PO TB24: 30.0000 mg | ORAL_TABLET | Freq: Every day | ORAL | 3 refills | Status: DC

## 2020-11-20 MED ORDER — METOPROLOL TARTRATE 100 MG PO TABS: 100.0000 mg | ORAL_TABLET | Freq: Once | ORAL | 0 refills | Status: DC

## 2020-11-20 NOTE — Patient Instructions (Addendum)
Good to see you today Continue current medicines Return as needed or in 6 months for physical.

## 2020-11-20 NOTE — Patient Instructions (Addendum)
Medication Instructions:  Your physician has recommended you make the following change in your medication:   START Isosorbide (Imdur) 30mg  daily    - If you develop headache while taking, you may stop.  *If you need a refill on your cardiac medications before your next appointment, please call your pharmacy*   Lab Work: Your physician recommends that you have lab work TODAY: CMET, Lipid panel  If you have labs (blood work) drawn today and your tests are completely normal, you will receive your results only by: Marland Kitchen MyChart Message (if you have MyChart) OR . A paper copy in the mail If you have any lab test that is abnormal or we need to change your treatment, we will call you to review the results.   Testing/Procedures: Your cardiac CT will be scheduled at one of the below locations:   [facility] [address] [place], [place] [address] [phone]  [facility] [address] [place], [place] [address] [phone]  If scheduled at [facility], please arrive at the [facility] main entrance (entrance A) of [facility] 30 minutes prior to test start time. Proceed to the [facility] Radiology Department (first floor) to check-in and test prep.  If scheduled at [facility] please arrive 15 mins early for check-in and test prep.  Please follow these instructions carefully (unless otherwise directed):  Hold all erectile dysfunction medications at least 3 days (72 hrs) prior to test.  On the Night Before the Test: . Be sure to Drink plenty of water. . Do not consume any caffeinated/decaffeinated beverages or chocolate 12 hours prior to your test. . Do not take any antihistamines 12 hours prior to your test.   On the Day of the Test: . Drink plenty of water until 1 hour prior to the test. . Do not eat any food 4 hours prior to the test. . You may take your regular  medications prior to the test.  . Take metoprolol (Lopressor) two hours prior to test.        After the Test: . Drink plenty of water. . After receiving IV contrast, you may experience a mild flushed feeling. This is normal. . On occasion, you may experience a mild rash up to 24 hours after the test. This is not dangerous. If this occurs, you can take Benadryl 25 mg and increase your fluid intake. . If you experience trouble breathing, this can be serious. If it is severe call 911 IMMEDIATELY. If it is mild, please call our office. . If you take any of these medications: Glipizide/Metformin, Avandament, Glucavance, please do not take 48 hours after completing test unless otherwise instructed.   Once we have confirmed authorization from your insurance company, we will call you to set up a date and time for your test. Based on how quickly your insurance processes prior authorizations requests, please allow up to 4 weeks to be contacted for scheduling your Cardiac CT appointment. Be advised that routine Cardiac CT appointments could be scheduled as many as 8 weeks after your provider has ordered it.  For non-scheduling related questions, please contact the cardiac imaging nurse navigator should you have any questions/concerns: Marchia Bond, Cardiac Imaging Nurse Navigator Gordy Clement, Cardiac Imaging Nurse Navigator [facility] Direct Office Dial: [phone]   For scheduling needs, including cancellations and rescheduling, please call [place], [phone].     Follow-Up: At [facility],  you and your health needs are our priority.  As part of our continuing mission to provide you with exceptional heart care, we have created designated Provider Care Teams.  These Care Teams include your primary Cardiologist (physician) and Advanced Practice Providers (APPs -  Physician Assistants and Nurse Practitioners) who all work together to provide you with the care you  need, when you need it.  We recommend signing up for the patient portal called "MyChart".  Sign up information is provided on this After Visit Summary.  MyChart is used to connect with patients for Virtual Visits (Telemedicine).  Patients are able to view lab/test results, encounter notes, upcoming appointments, etc.  Non-urgent messages can be sent to your provider as well.   To learn more about what you can do with MyChart, go to [email or web address].    Your next appointment:   4 week(s)  The format for your next appointment:   In Person  Provider:   You may see Kate Sable, MD or one of the following Advanced Practice Providers on your designated Care Team:     Marrianne Mood, [place]

## 2020-11-20 NOTE — Progress Notes (Signed)
Office Visit    Patient Name: Caleb Spencer Date of Encounter: 11/20/2020  PCP:  Ria Bush, [place]  Cardiologist:  Kate Sable, MD  Advanced Practice Provider:  No care team member to display Electrophysiologist:  None    Chief Complaint    Chief Complaint  Patient presents with  . Follow-up    1 Month follow up and c/o chest pains off and on.  Medications verbally reviewed with patient.     60 year old male with history of hypertension, hyperlipidemia, and who presents today for 1 month follow-up and review of echo results with report of CP.  Past Medical History    Past Medical History:  Diagnosis Date  . History of smoking   . Kidney stone   . Left groin hernia    working on finances to be able to do   Past Surgical History:  Procedure Laterality Date  . INGUINAL HERNIA REPAIR  1987   Left    Allergies  No Known Allergies  History of Present Illness    Caleb Spencer is a 60 y.o. male with PMH as above.  He has history of hypertension and hyperlipidemia.  He reports hypertension on his mother side.  No family history of early cardiac death before the age of 34.   Echo showed normal LV SF, aortic root dilation.  When last seen in office, he was working on reducing alcohol intake, drinking 3 beers daily.  He had stopped all tobacco products.  BP was well controlled on amlodipine with home pressures SBP 120s.  Seen 10/02/2020 with BP borderline at 130/80.  At home, he reported labile blood pressure with SBP 100-1 38 and DBP 70-72.  He was checking his blood pressure after work and at the end of each day.  He wondered if his blood pressure checks are influenced by the stress of his job.  He worked as a Nature conservation officer.  He had very rare and brief tachypalpitations. He noted DOE with heavy activity. He was drinking 3-4 beers per day. He was trying to reduce salt and was sleeping 6h each night. He had hand pain with  atorvastatin, switched to Crestor with improved sx. Echo and MPI ordered with recommendation for recheck lipid and liver function in 6 to 8 weeks.  10/23/2020 echo showed EF 45 to 50%, global hypokinesis, moderate LVE, G2 DD, mild MR, mild to moderate AR.  Today, 11/20/2020, he returns to clinic and notes chest pain that started over the past few weeks and has been on and off since that time.  He reports her chest pain is mainly occurring in the mornings when he wakes up and while he is getting ready for work/exerting himself. He further describes the chest discomfort as a pressure that occurs on the left side of his chest.  The chest pressure lasts maybe a minute or 2 before resolving with rest.  At most, he believes that the chest pressure may last 5 minutes.  He does not have the chest pressure when at work.   Associated symptoms include shortness of breath.  He also reports shortness of breath as anxious.  He reports home BP 137/78 with SBP mainly in the 130s.  He is drinking 3-4 beers daily. No tobacco use. He denies further tachypalpitations.  No dizziness or syncope.  No recent falls.  No signs or symptoms of bleeding.  He denies any signs or symptoms of volume overload.  He  reports medication compliance.  He reports improved hand pain since switching to Crestor, though he does have bilateral trigger finger that he hopes to avoid surgery for as long as possible, given he needs his hands for his job.  He is conscious of the salt he intakes in his diet with salt and fluid restrictions reviewed.  Echo reviewed together today.  Home Medications    Current Outpatient Medications on File Prior to Visit  Medication Sig Dispense Refill  . amLODipine (NORVASC) 10 MG tablet Take 1 tablet (10 mg total) by mouth daily. 30 tablet 5  . ASPIRIN 81 PO Take by mouth daily.    . Ibuprofen (MOTRIN PO) Take by mouth as needed. As needed for neck pain    . Multiple Vitamin (MULTIVITAMIN PO) Take 1 tablet by mouth daily.     . rosuvastatin (CRESTOR) 40 MG tablet Take 1 tablet (40 mg total) by mouth daily. 90 tablet 3   No current facility-administered medications on file prior to visit.    Review of Systems    He reports new episodes of chest pressure that occur early in the morning as described above.  He has SOB/ dyspnea with anxiety.  He reports improvement in bilateral hand pain since switching to Crestor.  No further tachypalpitations.  No pnd, orthopnea, n, v, dizziness, syncope, edema, weight gain, or early satiety.   All other systems reviewed and are otherwise negative except as noted above.  Physica -l Exam    VS:  BP 134/80 (BP Location: Left Arm, Patient Position: Sitting, Cuff Size: Normal)   Pulse 66   Ht 5\' 11"  (1.803 m)   Wt 194 lb (88 kg)   SpO2 98%   BMI 27.06 kg/m  , BMI Body mass index is 27.06 kg/m. GEN: Well nourished, well developed, in no acute distress. HEENT: normal. Neck: Supple, no JVD, carotid bruits, or masses. Cardiac: RRR, 1/6 systolic and 1/4 diastolic murmurs on exam, rubs, or gallops. No clubbing, cyanosis, edema.  Radials/DP/PT 2+ and equal bilaterally.  Respiratory:  Respirations regular and unlabored, clear to auscultation bilaterally. GI: Soft, nontender, nondistended, BS + x 4. MS: no deformity or atrophy. Bilateral trigger finger contraction noted of hands. Skin: warm and dry, no rash. Neuro:  Strength and sensation are intact. Psych: Normal affect.  Accessory Clinical Findings    ECG personally reviewed by me today -NSR, 66 bpm, LVH and IVCD with QRS 177ms, ST depression and T wave inversion as seen in prior EKGs persists in leads II, III, aVF, changes also noted V4-V6 as seen in prior EKGs, IVCD, repolarization changes- no acute changes.  VITALS Reviewed today   Temp Readings from Last 3 Encounters:  11/20/20 98.3 F (36.8 C) (Temporal)  05/22/20 97.8 F (36.6 C) (Temporal)  02/14/20 (!) 97.4 F (36.3 C) (Temporal)   BP Readings from Last 3  Encounters:  11/20/20 134/80  11/20/20 136/78  10/02/20 130/80   Pulse Readings from Last 3 Encounters:  11/20/20 66  11/20/20 66  10/02/20 72    Wt Readings from Last 3 Encounters:  11/20/20 194 lb (88 kg)  11/20/20 194 lb 1 oz (88 kg)  10/02/20 190 lb 4 oz (86.3 kg)     LABS  reviewed today   Lab Results  Component Value Date   WBC 9.6 01/31/2020   HGB 16.3 01/31/2020   HCT 47.9 01/31/2020   MCV 98.1 01/31/2020   PLT 203.0 01/31/2020   Lab Results  Component Value Date  CREATININE 0.88 05/15/2020   BUN 12 05/15/2020   NA 138 05/15/2020   K 4.0 05/15/2020   CL 103 05/15/2020   CO2 27 05/15/2020   Lab Results  Component Value Date   ALT 24 05/15/2020   AST 23 05/15/2020   ALKPHOS 49 05/15/2020   BILITOT 0.3 05/15/2020   Lab Results  Component Value Date   CHOL 173 05/15/2020   HDL 47.70 05/15/2020   LDLDIRECT 87.0 05/15/2020   TRIG 284.0 (H) 05/15/2020   CHOLHDL 4 05/15/2020    No results found for: HGBA1C Lab Results  Component Value Date   TSH 3.25 01/31/2020     STUDIES/PROCEDURES reviewed today   Echo 10/23/20 1. Left ventricular ejection fraction, by estimation, is 45 to 50%. The  left ventricle has mildly decreased function. The left ventricle  demonstrates global hypokinesis. The left ventricular internal cavity size  was moderately dilated. Left ventricular  diastolic parameters are consistent with Grade II diastolic dysfunction  (pseudonormalization). The average left ventricular global longitudinal  strain is -11.1 %. The global longitudinal strain is abnormal.  2. Right ventricular systolic function is normal. The right ventricular  size is normal.  3. The mitral valve is normal in structure. Mild mitral valve  regurgitation. No evidence of mitral stenosis.  4. The aortic valve is normal in structure, trileaflet. Aortic valve  regurgitation is mild to moderate. Mild aortic valve sclerosis is present,  with no evidence of aortic  valve stenosis.   Echo 01/2020 1. Left ventricular ejection fraction, by estimation, is 60 to 65%. The  left ventricle has normal function. The left ventricle has no regional  wall motion abnormalities. The left ventricular internal cavity size was  mildly dilated. Left ventricular  diastolic parameters were normal.  2. Right ventricular systolic function is normal. The right ventricular  size is normal. Tricuspid regurgitation signal is inadequate for assessing  PA pressure.  3. The aortic valve is tricuspid. Aortic valve regurgitation is mild.  4. There is borderline dilatation of the aortic root measuring 36 mm.  Ascending aorta 2.8 cm  5. The inferior vena cava is dilated in size with >50% respiratory  variability, suggesting right atrial pressure of 8 mmHg.   Assessment & Plan    Chest pain History of Abnormal EKG --History of abnormal EKG as seen at prior visits with report of CP and SOB this visit.  CP has both typical and atypical features. Euvolemic on exam with recent echo as above with reduced EF and LV global hypokinesis. Risk factors for cardiac ischemia include male, age greater than 14, hypertension, hyperlipidemia, and history of tobacco use. Given reduced EF and risk factors with new CP and EKG changes as above, discussed with primary cardiologist same day with recommendation for cardiac CT for now over that of invasive ischemic workup with catheterization. Discussed obtaining cCTA with pt agreeable to this plan. Ongoing RF modification recommended, including EtOH cessation.   He is aware to call the office or go to the ED if any further chest pain or worsening shortness of breath. Continue ASA, statin.  Will start low dose Imdur today for antianginal effect and further BP support with recommendation to discontinue if HA given to pt today.    Chronic combined systolic and diastolic heart failure Newly reduced EF and G2DD by echo 10/2020  -- Reports shortness of breath  with chest pain, as well as with anxiety. Newly reduced EF and LV global hypokinesis by echo as above with G2DD.  Further ischemic workup of reduced EF recommended with case discussed with primary cardiologist and plan for Cardiac CT as discussed and agreed upon by pt. Euvolemic on exam and denies any symptoms of volume overload.  Continue to monitor volume status with recommendation for home monitoring of BP and weights.  Continue current Norvasc 10 mg daily and newly added Imdur 30 mg.  At RTC, consider addition of ACE/ARB.  Escalation of GDMT at future clinic visits recommended with Wilder Glade +/- Spironolactone.  Essential hypertension, goal BP <130/80  -BP borderline today as at previous visits. Reviewed recommendations for low-salt diet, fluid restriction.  Discussed avoiding NSAIDs and reducing alcohol intake.  Reviewed lifestyle changes, including diet and increased activity. Continue current amlodipine 10 mg daily. Will start Imdur 30mg  daily today as above for both BP support and antianginal effect. Recommended he monitor his BP at home with goal blood pressure 130/80 or lower. Discussed importance of BP control with Ao root dilation with pt understanding.  Aortic root dilation, 3.7 cm (10/2020) --As seen on prior echos with most recent echo showing Ao root 3.7cm and Asc Ao 3.1cm.  Previous echo with aortic root dilation at 36 mm 01/2020.  Plan is for chest CT of the aorta at same time as the cardiac CT to get the measurement of the aorta.  Reviewed recommendations to avoid fluoroquinolones and heavy lifting, as well is control heart rate, blood pressure, and cholesterol.  Recommendations provided on AVS as well.  Continue ASA, statin.   Valvular dysfunction: AR, MR --Mild MR and mild to moderate AR seen on exam. Reviewed s/sx of worsening valvular dz with pt and recommendations for risk factor modification. HR/BP/LDL control. Continue to monitor with periodic echo.   HLD, LDL goal  <70 Hypertriglyceridemia --04/2020 LDL 87.0 with triglycerides 284.0.  Continue Crestor 40 mg daily. Will recheck lipids today, given previously elevated Tg and LDL.  If LDL still not well controlled at that time, consider Zetia.  If triglycerides remain elevated on recheck, consider Vascepa.    Hand pain, bilateral, improved --Improved hand pain with transition from atorvastatin to Crestor 40 mg daily.  We will continue current Crestor with recheck of lipids as above to ensure LDL at goal.  He will follow-up with the appropriate hand specialist regarding his trigger finger as needed.  Alcohol use, current  --Discussed the effect of alcohol on the heart with recommendation for complete cessation.  He currently drinks 3-4 beers per day.  History of tobacco use --Ongoing cessation recommended.  Medication changes: Start Imdur 30mg  daily. Labs ordered: CMET, lipids Studies / Imaging ordered: Coronary CTA, chest CT of Aorta  Future considerations: Escalation of GDMT, recommendations based off of Coronary CT and labs above Disposition: RTC after cCTA  Arvil Chaco, PA-C 11/20/2020

## 2020-11-20 NOTE — Progress Notes (Signed)
Patient ID: Caleb Spencer, male    DOB: 25-May-1961, 60 y.o.   MRN: [id number]  This visit was conducted in person.  BP 136/78   Pulse 66   Temp 98.3 F (36.8 C) (Temporal)   Ht 5\' 11"  (1.803 m)   Wt 194 lb 1 oz (88 kg)   SpO2 98%   BMI 27.07 kg/m    CC: 6 mo f/u visit  Subjective:   HPI: Caleb Spencer is a 60 y.o. male presenting on 11/20/2020 for Follow-up (Here for 6 mo f/u.)   Seeing cardiology for CHF with decreased EF and diastolic dysfunction.  Mild-mod aortic insufficiency, mild mitral insufficiency.  Echo reviewed from 10/2020 - EF 45-50%, global LV hypokinesis, mod dilated LV, G2DD, mild MR, mild-mod AR with mild aortic sclerosis.  Upcoming cards appt later today.   HTN - Compliant with current antihypertensive regimen of amlodipine 10mg  daily. Does check blood pressures at home: good control. No low blood pressure readings or symptoms of dizziness/syncope. Denies HA, vision changes, SOB, leg swelling. Notes some chest discomfort with full exhalation. Will discuss with cardiology later today.   R inguinal hernia - repair currently unaffordable - working on saving for deposit for deductible for surgery.      Relevant past medical, surgical, family and social history reviewed and updated as indicated. Interim medical history since our last visit reviewed. Allergies and medications reviewed and updated. Outpatient Medications Prior to Visit  Medication Sig Dispense Refill  . amLODipine (NORVASC) 10 MG tablet Take 1 tablet (10 mg total) by mouth daily. 30 tablet 5  . ASPIRIN 81 PO Take by mouth daily.    . Ibuprofen (MOTRIN PO) Take by mouth as needed. As needed for neck pain    . Multiple Vitamin (MULTIVITAMIN PO) Take 1 tablet by mouth daily.    . rosuvastatin (CRESTOR) 40 MG tablet Take 1 tablet (40 mg total) by mouth daily. 90 tablet 3   No facility-administered medications prior to visit.     Per HPI unless specifically indicated in ROS section below Review of  Systems Objective:  BP 136/78   Pulse 66   Temp 98.3 F (36.8 C) (Temporal)   Ht 5\' 11"  (1.803 m)   Wt 194 lb 1 oz (88 kg)   SpO2 98%   BMI 27.07 kg/m   Wt Readings from Last 3 Encounters:  11/20/20 194 lb (88 kg)  11/20/20 194 lb 1 oz (88 kg)  10/02/20 190 lb 4 oz (86.3 kg)      Physical Exam Vitals and nursing note reviewed.  Constitutional:      Appearance: Normal appearance. He is not ill-appearing.  Cardiovascular:     Rate and Rhythm: Normal rate and regular rhythm.     Pulses: Normal pulses.     Heart sounds: Normal heart sounds. No murmur heard.   Pulmonary:     Effort: Pulmonary effort is normal. No respiratory distress.     Breath sounds: Normal breath sounds. No wheezing, rhonchi or rales.  Musculoskeletal:     Right lower leg: No edema.     Left lower leg: No edema.  Neurological:     Mental Status: He is alert.  Psychiatric:        Mood and Affect: Mood normal.        Behavior: Behavior normal.       Assessment & Plan:  This visit occurred during the SARS-CoV-2 public health emergency.  Safety protocols were in place,  including screening questions prior to the visit, additional usage of staff PPE, and extensive cleaning of exam room while observing appropriate contact time as indicated for disinfecting solutions.   Problem List Items Addressed This Visit    Hypertension - Primary    Chronic, overall stable period on current regimen of amlodipine 10mg  daily - continue this.       HFrEF (heart failure with reduced ejection fraction) ([place])    Appreciate cards care.       Aortic root dilatation (HCC)    3.7cm on latest echo. Monitored by cardiology.       Valvular heart disease    Mild MR, mild-mod AR          No orders of the defined types were placed in this encounter.  No orders of the defined types were placed in this encounter.   Patient Instructions  Good to see you today Continue current medicines Return as needed or in 6 months  for physical.    Follow up plan: Return in about 6 months (around 05/23/2021) for annual exam, prior fasting for blood work.  Ria Bush, MD

## 2020-11-21 LAB — COMPREHENSIVE METABOLIC PANEL
ALT: 37 IU/L (ref 0–44)
AST: 31 IU/L (ref 0–40)
Albumin/Globulin Ratio: 1.6 (ref 1.2–2.2)
Albumin: 5.1 g/dL — ABNORMAL HIGH (ref 3.8–4.9)
Alkaline Phosphatase: 53 IU/L (ref 44–121)
BUN/Creatinine Ratio: 16 (ref 9–20)
BUN: 14 mg/dL (ref 6–24)
Bilirubin Total: 0.5 mg/dL (ref 0.0–1.2)
CO2: 22 mmol/L (ref 20–29)
Calcium: 9.7 mg/dL (ref 8.7–10.2)
Chloride: 100 mmol/L (ref 96–106)
Creatinine, Ser: 0.88 mg/dL (ref 0.76–1.27)
Globulin, Total: 3.1 g/dL (ref 1.5–4.5)
Glucose: 99 mg/dL (ref 65–99)
Potassium: 4.2 mmol/L (ref 3.5–5.2)
Sodium: 141 mmol/L (ref 134–144)
Total Protein: 8.2 g/dL (ref 6.0–8.5)
eGFR: 99 mL/min/{1.73_m2} (ref >59)

## 2020-11-21 LAB — LIPID PANEL
Chol/HDL Ratio: 2.4 ratio (ref 0.0–5.0)
Cholesterol, Total: 175 mg/dL (ref 100–199)
HDL: 73 mg/dL (ref >39)
LDL Chol Calc (NIH): 82 mg/dL (ref 0–99)
Triglycerides: 115 mg/dL (ref 0–149)
VLDL Cholesterol Cal: 20 mg/dL (ref 5–40)

## 2020-11-24 ENCOUNTER — Encounter: Payer: Self-pay | Admitting: Family Medicine

## 2020-11-24 ENCOUNTER — Telehealth: Payer: Self-pay | Admitting: *Deleted

## 2020-11-24 DIAGNOSIS — I38 Endocarditis, valve unspecified: Secondary | ICD-10-CM | POA: Insufficient documentation

## 2020-11-24 DIAGNOSIS — I77810 Thoracic aortic ectasia: Secondary | ICD-10-CM | POA: Insufficient documentation

## 2020-11-24 DIAGNOSIS — I5020 Unspecified systolic (congestive) heart failure: Secondary | ICD-10-CM | POA: Insufficient documentation

## 2020-11-24 NOTE — Assessment & Plan Note (Signed)
3.7cm on latest echo. Monitored by cardiology.

## 2020-11-24 NOTE — Telephone Encounter (Signed)
Patient returning call.

## 2020-11-24 NOTE — Telephone Encounter (Signed)
Attempted to call pt to review results. No answer. Lmtcb.  

## 2020-11-24 NOTE — Assessment & Plan Note (Signed)
Appreciate cards care. 

## 2020-11-24 NOTE — Assessment & Plan Note (Signed)
Mild MR, mild-mod AR

## 2020-11-24 NOTE — Assessment & Plan Note (Addendum)
Chronic, overall stable period on current regimen of amlodipine 10mg  daily - continue this.

## 2020-11-24 NOTE — Telephone Encounter (Signed)
-----   Message from Arvil Chaco, PA-C sent at 11/23/2020  5:58 AM EDT ----- --Kidney function stable before cardiac CT. --Tg improved from previous labs. --LDL 82 with goal LDL below 70.   Recommend addition of Zetia 10mg  daily if pt agreeable.

## 2020-11-26 MED ORDER — EZETIMIBE 10 MG PO TABS: 10.0000 mg | ORAL_TABLET | Freq: Every day | ORAL | 3 refills | Status: DC

## 2020-11-26 NOTE — Telephone Encounter (Signed)
Spoke to pt. Notified of lab results and provider's recc.  Pt verbalized understanding.  He agrees to start Zetia 10mg  daily.  Rx sent to [facility] per pt request.  Pt has no further questions at this time.

## 2020-12-17 ENCOUNTER — Telehealth: Payer: Self-pay | Admitting: *Deleted

## 2020-12-17 DIAGNOSIS — R072 Precordial pain: Secondary | ICD-10-CM

## 2020-12-17 DIAGNOSIS — Z01812 Encounter for preprocedural laboratory examination: Secondary | ICD-10-CM

## 2020-12-17 DIAGNOSIS — I5020 Unspecified systolic (congestive) heart failure: Secondary | ICD-10-CM

## 2020-12-17 NOTE — Telephone Encounter (Signed)
-----   Message from Lorenza Evangelist, RN sent at 12/17/2020  3:03 PM EDT ----- Regarding: FW: ct heart Hey guys,  Because he specifically asked for a Friday, he was pushed out to July. We will need a new BMP prior to that CCTA appt at [facility] on 01/22/21 at [place]  Thank you, Marchia Bond ----- Message ----- From: Melony Overly Sent: 12/17/2020  11:48 AM EDT To: Lorenza Evangelist, RN, Eli Hose, RN Subject: Melton Alar: ct heart                                   Called to r/s for 01/22/21 at 10:00 ----- Message ----- From: Melony Overly Sent: 12/17/2020   9:04 AM EDT To: Ciro Backer, Lorenza Evangelist, RN, # Subject: ct heart                                       Scheduled 12/25/20 at 12:15   He will be going to [facility] due to the fact he can only come on Friday. Can he be switched from [facility].   Thanks, [place]

## 2020-12-17 NOTE — Telephone Encounter (Signed)
Spoke to pt's sister, Thayer Headings, ok per DPR.  Notified that pt will need repeat BMET within 30 days of CCTA and will need to repeat anytime after 12/23/20 prior to CCTA on 7/8.  Thayer Headings voiced understanding. Will make sure pt has repeat labs at the [facility].  Lab orders placed.

## 2020-12-18 ENCOUNTER — Ambulatory Visit: Payer: BC Managed Care – PPO | Admitting: Physician Assistant

## 2020-12-22 ENCOUNTER — Other Ambulatory Visit: Payer: Self-pay

## 2020-12-22 MED ORDER — AMLODIPINE BESYLATE 10 MG PO TABS: 10.0000 mg | ORAL_TABLET | Freq: Every day | ORAL | 5 refills | Status: DC

## 2020-12-25 ENCOUNTER — Ambulatory Visit (HOSPITAL_COMMUNITY): Payer: BC Managed Care – PPO

## 2020-12-29 ENCOUNTER — Other Ambulatory Visit
Admission: RE | Admit: 2020-12-29 | Discharge: 2020-12-29 | Disposition: A | Discharge status: Home / Self Care | Payer: BC Managed Care – PPO | Source: Ambulatory Visit | Attending: Physician Assistant | Admitting: Physician Assistant

## 2020-12-29 DIAGNOSIS — Z01812 Encounter for preprocedural laboratory examination: Secondary | ICD-10-CM | POA: Diagnosis not present

## 2020-12-29 DIAGNOSIS — R072 Precordial pain: Secondary | ICD-10-CM | POA: Insufficient documentation

## 2020-12-29 LAB — BASIC METABOLIC PANEL
Anion gap: 7 (ref 5–15)
BUN: 11 mg/dL (ref 6–20)
CO2: 28 mmol/L (ref 22–32)
Calcium: 9 mg/dL (ref 8.9–10.3)
Chloride: 103 mmol/L (ref 98–111)
Creatinine, Ser: 0.86 mg/dL (ref 0.61–1.24)
GFR, Estimated: >60 mL/min (ref >60)
Glucose, Bld: 130 mg/dL — ABNORMAL HIGH (ref 70–99)
Potassium: 4.1 mmol/L (ref 3.5–5.1)
Sodium: 138 mmol/L (ref 135–145)

## 2020-12-31 NOTE — Telephone Encounter (Signed)
Pt had labs completed 12/29/20.

## 2021-01-01 ENCOUNTER — Telehealth: Payer: Self-pay | Admitting: Physician Assistant

## 2021-01-01 NOTE — Telephone Encounter (Signed)
Arvil Chaco, PA-C  12/30/2020 11:35 PM EDT      Renal function and electrolytes stable for cCTA. No further recommendations.

## 2021-01-01 NOTE — Telephone Encounter (Signed)
Attempted to call the patient. No answer- no voice mail set up.

## 2021-01-04 NOTE — Telephone Encounter (Signed)
Spoke with patients sister per release form. Reviewed results and recommendation with no further questions at this time.

## 2021-01-20 ENCOUNTER — Other Ambulatory Visit (HOSPITAL_COMMUNITY): Payer: Self-pay | Admitting: Emergency Medicine

## 2021-01-20 DIAGNOSIS — I77810 Thoracic aortic ectasia: Secondary | ICD-10-CM

## 2021-01-20 NOTE — Progress Notes (Signed)
CTA chest aorta added to be done with CCTA this Friday to evaluate known aortic root dilatation.  Marchia Bond RN Navigator Cardiac Imaging [facility] [phone] Office  [phone] Cell

## 2021-01-21 ENCOUNTER — Telehealth (HOSPITAL_COMMUNITY): Payer: Self-pay | Admitting: Emergency Medicine

## 2021-01-21 NOTE — Telephone Encounter (Signed)
Reaching out to patient to offer assistance regarding upcoming cardiac imaging study; pt verbalizes understanding of appt date/time, parking situation and where to check in, pre-test NPO status and medications ordered, and verified current allergies; name and call back number provided for further questions should they arise Marchia Bond RN Navigator Cardiac Imaging [facility] [phone] office [phone] cell  Spoke to sister  100mg  metoprolol tartrate Denies iv issues [place] status unknown

## 2021-01-22 ENCOUNTER — Other Ambulatory Visit: Payer: Self-pay

## 2021-01-22 ENCOUNTER — Other Ambulatory Visit: Payer: Self-pay | Admitting: Internal Medicine

## 2021-01-22 ENCOUNTER — Ambulatory Visit (HOSPITAL_COMMUNITY)
Admission: RE | Admit: 2021-01-22 | Discharge: 2021-01-22 | Disposition: A | Discharge status: Home / Self Care | Payer: BC Managed Care – PPO | Source: Ambulatory Visit | Attending: Physician Assistant | Admitting: Physician Assistant

## 2021-01-22 ENCOUNTER — Ambulatory Visit (HOSPITAL_COMMUNITY)
Admission: RE | Admit: 2021-01-22 | Discharge: 2021-01-22 | Disposition: A | Discharge status: Home / Self Care | Payer: BC Managed Care – PPO | Source: Ambulatory Visit | Attending: Internal Medicine | Admitting: Internal Medicine

## 2021-01-22 DIAGNOSIS — I77810 Thoracic aortic ectasia: Secondary | ICD-10-CM | POA: Insufficient documentation

## 2021-01-22 DIAGNOSIS — R072 Precordial pain: Secondary | ICD-10-CM | POA: Insufficient documentation

## 2021-01-22 DIAGNOSIS — R931 Abnormal findings on diagnostic imaging of heart and coronary circulation: Secondary | ICD-10-CM

## 2021-01-22 DIAGNOSIS — I359 Nonrheumatic aortic valve disorder, unspecified: Secondary | ICD-10-CM | POA: Diagnosis not present

## 2021-01-22 DIAGNOSIS — I719 Aortic aneurysm of unspecified site, without rupture: Secondary | ICD-10-CM | POA: Diagnosis not present

## 2021-01-22 IMAGING — CT CT HEART MORP W/ CTA COR W/ SCORE W/ CA W/CM &/OR W/O CM
4 of 5 series · 11 of 20 positions shown, 12 images · IV contrast (APPLIED)
Comparison: None.
COMPARISON: None.

Addendum:
EXAM:
OVER-READ INTERPRETATION  CT CHEST

The following report is an over-read performed by radiologist Dr.
over-read does not include interpretation of cardiac or coronary
anatomy or pathology. The coronary CTA interpretation by the
cardiologist is attached.
HISTORY: 60 yo male with chest pain, nonspecific New dx HFrEF
Cardiac/Coronary CTA
TECHNIQUE: The patient was scanned on a Siemens Force scanner.
PROTOCOL: A 120 kV prospective scan was triggered in the descending thoracic
aorta at 111 HU's. Axial non-contrast 3 mm slices were carried out
through the heart. The data set was analyzed on a dedicated work
station and scored using the Agatson method. Gantry rotation speed
was 250 msecs and collimation was .6 mm. Beta blockade and 0.8 mg of
sl NTG was given. The 3D data set was reconstructed in 5% intervals
of the 0-90 % of the R-R cycle. Diastolic phases were analyzed on a
dedicated work station using MPR, MIP and VRT modes. The patient
received 95mL OMNIPAQUE IOHEXOL 350 MG/ML SOLN of contrast.

[Series 4: best diast · axial · 0.40mm/px · z∈[-241,-196]mm · 2 of 340 slices shown]
[im 114/340  vessel]
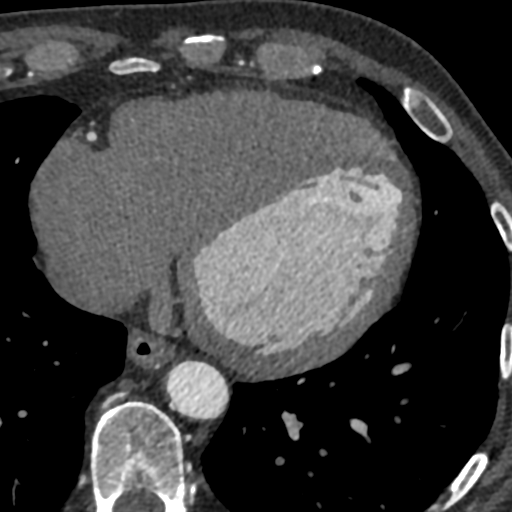
[im 227/340  vessel]
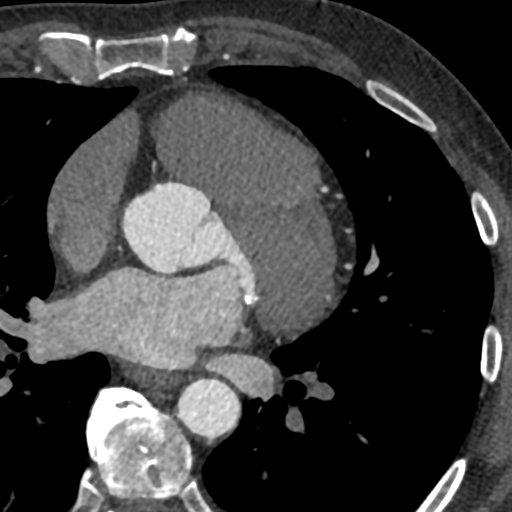

[Series 5: best syst · axial · 0.40mm/px · z∈[-253,-185]mm · 3 of 340 slices shown, 4 images]
[im 85/340  vessel]
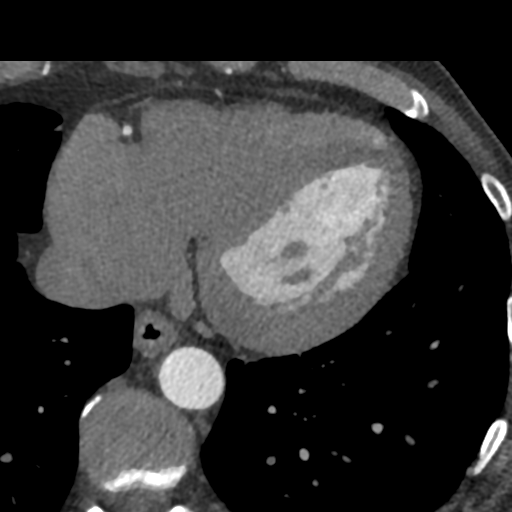
[im 85/340  lung]
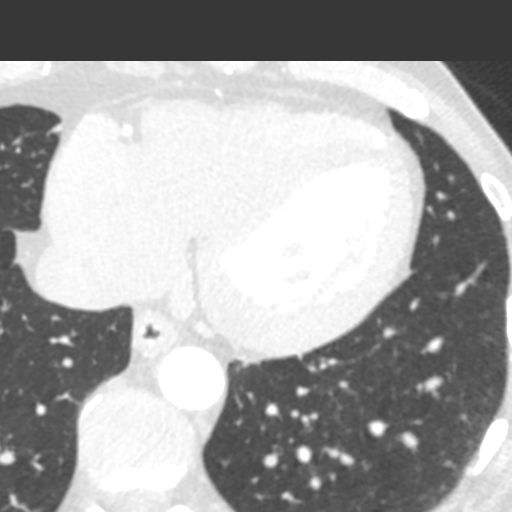
[im 170/340  vessel]
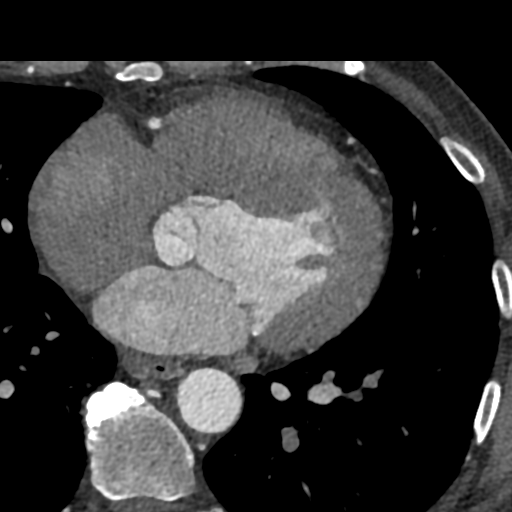
[im 255/340  vessel]
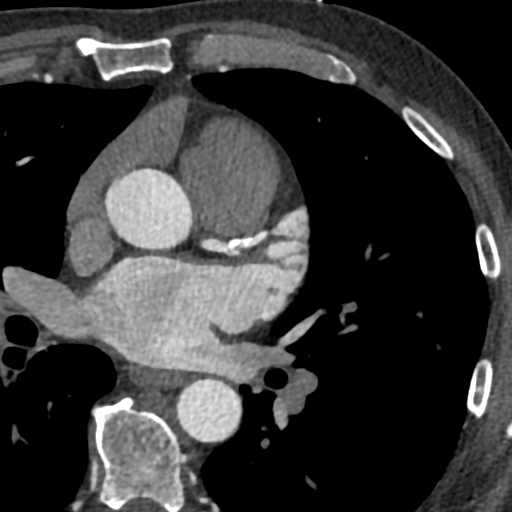

[Series 6: ts diast · axial · 0.40mm/px · z∈[-253,-185]mm · 3 of 340 slices shown]
[im 85/340  vessel]
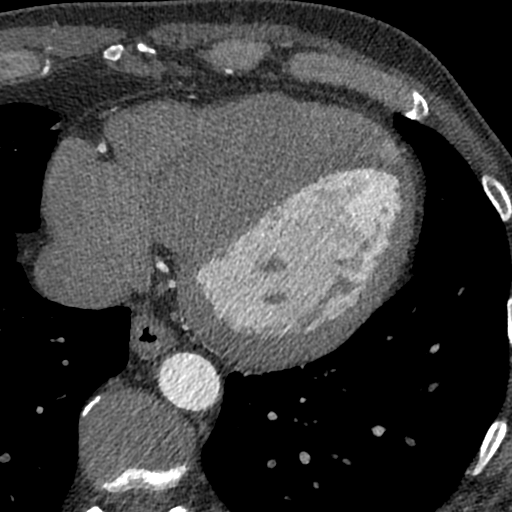
[im 170/340  vessel]
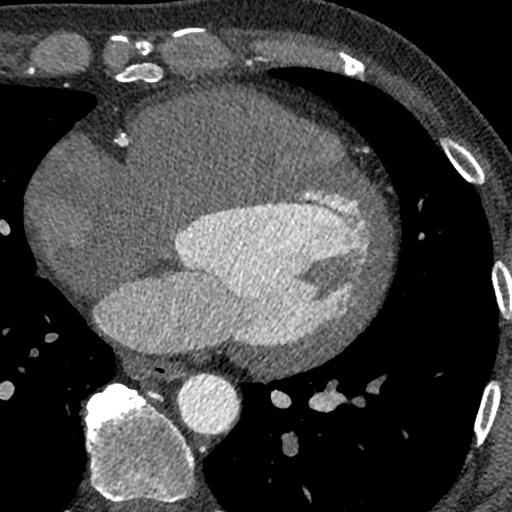
[im 255/340  vessel]
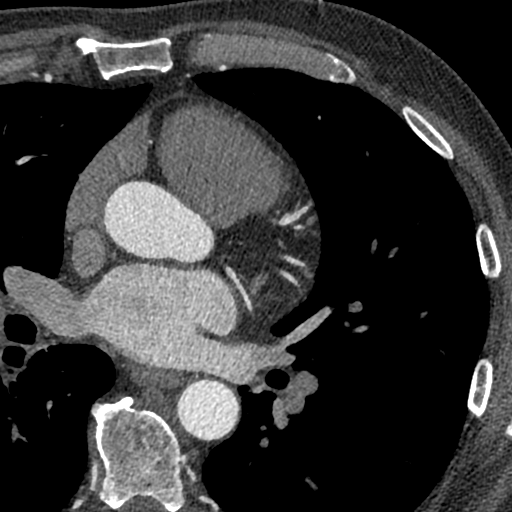

[Series 7: ts syst · axial · 0.40mm/px · z∈[-253,-185]mm · 3 of 340 slices shown]
[im 85/340  vessel]
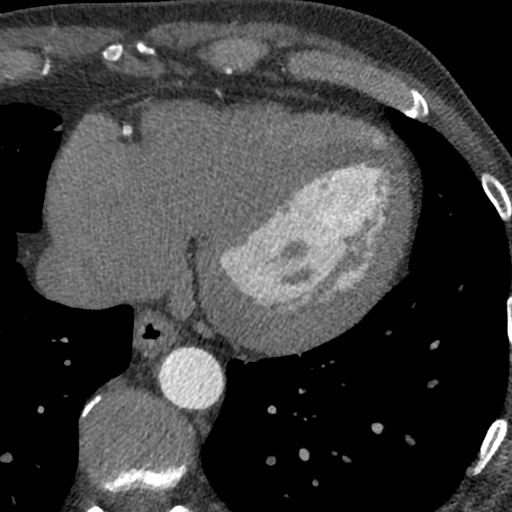
[im 170/340  vessel]
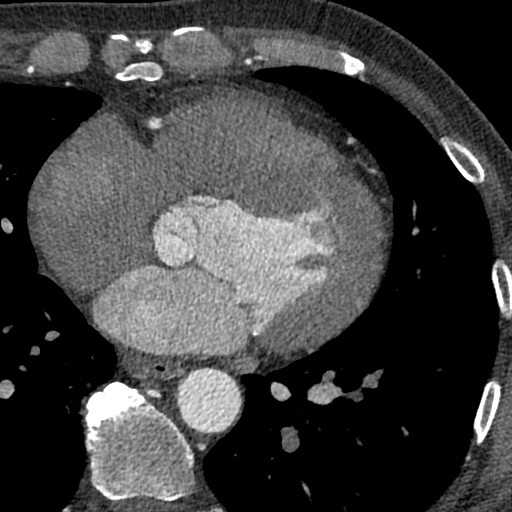
[im 255/340  vessel]
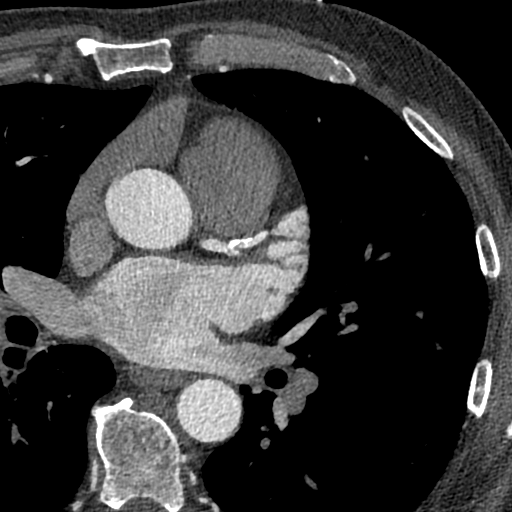

[11 of 20 positions shown; findings below may reference images not displayed]

FINDINGS: A full CTA of the chest was also performed simultaneously and is
dictated separately. Please see separate report.
IMPRESSION: See separately dictated full CTA of the chest.
FINDINGS: Quality: Good, HR 50

Coronary calcium score: The patient's coronary artery calcium score
is 510, which places the patient in the 92nd percentile.

Coronary arteries: Normal coronary origins.  Right dominance.

Right Coronary Artery: Dominant. Minimal mixed 1-24% non-obstructive
stenoses (RT2TT2PV) of the proximal and mid-RCA.

Left Main Coronary Artery: Normal. Bifurcates into the LAD and LCx
arteries.

Left Anterior Descending Coronary Artery: Anterior vessel which is
calcified proximally. There is minimal to mild (25-49%) stenosis
([id number]).

Ramus Intermedius Artery: Small, no disease. Several small diagonal
branches without disease.

Left Circumflex Artery: AV groove vessel, minimal 1-24% proximal
mixed stenosis (RT2TT2PV).

Aorta: Normal size, 30 mm at the mid ascending aorta (level of the
PA bifurcation) measured double oblique. No calcifications. No
dissection.

Aortic Valve: Trileaflet.  No calcifications.

Other findings:

Normal pulmonary vein drainage into the left atrium.

Normal left atrial appendage without a thrombus.

Normal size of the pulmonary artery.
IMPRESSION: 1. Minimal to mild mixed non-obstructive CAD, CADRADS = 2. Will send
FFR given heavy proximal LAD stenosis, however, it does not appear
obstructive. Blooming artifact is suspected.

2. Coronary calcium score of 510. This was 92nd percentile for age
and sex matched control.

3. Normal coronary origin with right dominance.

4. No aortic root dilatation or thoracic aortic aneurysm noted.

5. Aggressive cardiovascular risk factor modification is
recommended.

*** End of Addendum ***
EXAM:
OVER-READ INTERPRETATION  CT CHEST

The following report is an over-read performed by radiologist Dr.
over-read does not include interpretation of cardiac or coronary
anatomy or pathology. The coronary CTA interpretation by the
cardiologist is attached.
FINDINGS: A full CTA of the chest was also performed simultaneously and is
dictated separately. Please see separate report.
IMPRESSION: See separately dictated full CTA of the chest.

## 2021-01-22 MED ORDER — IOHEXOL 350 MG/ML SOLN
95.0000 mL | Freq: Once | INTRAVENOUS | Status: AC | PRN
  Administered 2021-01-22: 95 mL via INTRAVENOUS

## 2021-01-22 MED ORDER — NITROGLYCERIN 0.4 MG SL SUBL
SUBLINGUAL_TABLET | SUBLINGUAL | Status: AC
  Filled 2021-01-22: qty 2

## 2021-01-22 MED ORDER — NITROGLYCERIN 0.4 MG SL SUBL
0.8000 mg | SUBLINGUAL_TABLET | Freq: Once | SUBLINGUAL | Status: AC
  Administered 2021-01-22: 0.8 mg via SUBLINGUAL

## 2021-01-22 NOTE — Progress Notes (Signed)
Will send FFR for abnormal coronary calcium

## 2021-02-19 ENCOUNTER — Other Ambulatory Visit: Payer: Self-pay

## 2021-02-19 ENCOUNTER — Ambulatory Visit (INDEPENDENT_AMBULATORY_CARE_PROVIDER_SITE_OTHER): Payer: BC Managed Care – PPO | Admitting: Physician Assistant

## 2021-02-19 ENCOUNTER — Other Ambulatory Visit
Admission: RE | Admit: 2021-02-19 | Discharge: 2021-02-19 | Disposition: A | Discharge status: Home / Self Care | Payer: BC Managed Care – PPO | Source: Ambulatory Visit | Attending: Physician Assistant | Admitting: Physician Assistant

## 2021-02-19 ENCOUNTER — Encounter: Payer: Self-pay | Admitting: Physician Assistant

## 2021-02-19 VITALS — BP 150/78 | HR 62 | Ht 71.0 in | Wt 196.0 lb

## 2021-02-19 DIAGNOSIS — Z87891 Personal history of nicotine dependence: Secondary | ICD-10-CM

## 2021-02-19 DIAGNOSIS — Z7289 Other problems related to lifestyle: Secondary | ICD-10-CM

## 2021-02-19 DIAGNOSIS — Z01812 Encounter for preprocedural laboratory examination: Secondary | ICD-10-CM

## 2021-02-19 DIAGNOSIS — I5020 Unspecified systolic (congestive) heart failure: Secondary | ICD-10-CM | POA: Diagnosis not present

## 2021-02-19 DIAGNOSIS — R0600 Dyspnea, unspecified: Secondary | ICD-10-CM | POA: Diagnosis not present

## 2021-02-19 DIAGNOSIS — E785 Hyperlipidemia, unspecified: Secondary | ICD-10-CM

## 2021-02-19 DIAGNOSIS — Z789 Other specified health status: Secondary | ICD-10-CM

## 2021-02-19 DIAGNOSIS — I77810 Thoracic aortic ectasia: Secondary | ICD-10-CM

## 2021-02-19 DIAGNOSIS — R931 Abnormal findings on diagnostic imaging of heart and coronary circulation: Secondary | ICD-10-CM | POA: Insufficient documentation

## 2021-02-19 DIAGNOSIS — E781 Pure hyperglyceridemia: Secondary | ICD-10-CM

## 2021-02-19 DIAGNOSIS — R072 Precordial pain: Secondary | ICD-10-CM | POA: Diagnosis not present

## 2021-02-19 DIAGNOSIS — E7800 Pure hypercholesterolemia, unspecified: Secondary | ICD-10-CM

## 2021-02-19 DIAGNOSIS — Z01818 Encounter for other preprocedural examination: Secondary | ICD-10-CM

## 2021-02-19 DIAGNOSIS — F1090 Alcohol use, unspecified, uncomplicated: Secondary | ICD-10-CM

## 2021-02-19 LAB — BASIC METABOLIC PANEL
Anion gap: 8 (ref 5–15)
BUN: 13 mg/dL (ref 6–20)
CO2: 26 mmol/L (ref 22–32)
Calcium: 9.1 mg/dL (ref 8.9–10.3)
Chloride: 103 mmol/L (ref 98–111)
Creatinine, Ser: 0.79 mg/dL (ref 0.61–1.24)
GFR, Estimated: >60 mL/min (ref >60)
Glucose, Bld: 99 mg/dL (ref 70–99)
Potassium: 4 mmol/L (ref 3.5–5.1)
Sodium: 137 mmol/L (ref 135–145)

## 2021-02-19 LAB — CBC
HCT: 44.4 % (ref 39.0–52.0)
Hemoglobin: 15.4 g/dL (ref 13.0–17.0)
MCH: 33 pg (ref 26.0–34.0)
MCHC: 34.7 g/dL (ref 30.0–36.0)
MCV: 95.3 fL (ref 80.0–100.0)
Platelets: 186 10*3/uL (ref 150–400)
RBC: 4.66 MIL/uL (ref 4.22–5.81)
RDW: 13.6 % (ref 11.5–15.5)
WBC: 9.6 10*3/uL (ref 4.0–10.5)
nRBC: 0 % (ref 0.0–0.2)

## 2021-02-19 MED ORDER — POTASSIUM CHLORIDE ER 10 MEQ PO TBCR: 10.0000 meq | EXTENDED_RELEASE_TABLET | ORAL | 2 refills | Status: DC | PRN

## 2021-02-19 MED ORDER — FUROSEMIDE 20 MG PO TABS
20.0000 mg | ORAL_TABLET | ORAL | 2 refills | Status: DC | PRN
Start: 2021-02-19 — End: 2021-07-30

## 2021-02-19 NOTE — Patient Instructions (Signed)
Medication Instructions:  Your physician has recommended you make the following change in your medication:   TAKE Furosemide (Lasix) 20 mg as needed for weight gain of 3 pounds overnight or 5 pounds in one week.  TAKE Potassium (KCL) 10 mEq when you take Furosemide (Lasix)  *If you need a refill on your cardiac medications before your next appointment, please call your pharmacy*   Lab Work: CBC & BMET over at the [place] entrance today.   If you have labs (blood work) drawn today and your tests are completely normal, you will receive your results only by: [place] (if you have MyChart) OR A paper copy in the mail If you have any lab test that is abnormal or we need to change your treatment, we will call you to review the results.   Testing/Procedures: [facility] Cardiac Cath Instructions  You are scheduled for a Cardiac Cath on: Tuesday August 16 th Please arrive at 07:30 am on the day of your procedure Please expect a call from our [place] to pre-register you Do not eat/drink anything after midnight Someone will need to drive you home It is recommended someone be with you for the first 24 hours after your procedure Wear clothes that are easy to get on/off and wear slip on shoes if possible   Medications bring a current list of all medications with you  _XX__ You may take all of your medications the morning of your procedure with enough water to swallow safely  __XX__ HOLD Furosemide the morning of your procedure.   Day of your procedure: Arrive at the [facility] entrance.  Free valet service is available.  After entering the [place] please check-in at the registration desk (1st desk on your right) to receive your armband. After receiving your armband someone will escort you to the cardiac cath/special procedures waiting area.  The usual length of stay after your procedure is about 2 to 3 hours.  This can vary.  If you have any questions,  please call our office at [phone], or you may call the cardiac cath lab at [facility] directly at [phone]    Follow-Up: At [facility], you and your health needs are our priority.  As part of our continuing mission to provide you with exceptional heart care, we have created designated Provider Care Teams.  These Care Teams include your primary Cardiologist (physician) and Advanced Practice Providers (APPs -  Physician Assistants and Nurse Practitioners) who all work together to provide you with the care you need, when you need it.  We recommend signing up for the patient portal called "MyChart".  Sign up information is provided on this After Visit Summary.  MyChart is used to connect with patients for Virtual Visits (Telemedicine).  Patients are able to view lab/test results, encounter notes, upcoming appointments, etc.  Non-urgent messages can be sent to your provider as well.   To learn more about what you can do with MyChart, go to [email or web address].    Your next appointment:   2 week(s) after your procedure.   The format for your next appointment:   In Person  Provider:   Kate Sable, MD or Marrianne Mood, PA-C

## 2021-02-19 NOTE — Progress Notes (Signed)
Office Visit    Patient Name: OXFORD FOGLER Date of Encounter: 02/19/2021  PCP:  Ria Bush, [place]  Cardiologist:  Kate Sable, MD  Advanced Practice Provider:  No care team member to display Electrophysiologist:  None    Chief Complaint    Chief Complaint  Patient presents with   Follow-up    Follow up and medications verbally reviewed with patient.     60 year old male with history of hypertension, hyperlipidemia, and who presents today for 1 month follow-up and review of echo results with report of CP.  Past Medical History    Past Medical History:  Diagnosis Date   History of smoking    Kidney stone    Left groin hernia    working on finances to be able to do   Past Surgical History:  Procedure Laterality Date   INGUINAL HERNIA REPAIR  1987   Left    Allergies  Allergies  Allergen Reactions   Imdur [Isosorbide Nitrate] Other (See Comments)    Gets a headache    History of Present Illness    KIMBER MENNE is a 60 y.o. male with PMH as above.  He has history of hypertension and hyperlipidemia.  He reports hypertension on his mother side.  No family history of early cardiac death before the age of 43.   Echo showed normal LV SF, aortic root dilation.  When last seen in office, he was working on reducing alcohol intake, drinking 3 beers daily.  He had stopped all tobacco products.  BP was well controlled on amlodipine with home pressures SBP 120s.  Seen 10/02/2020 with BP borderline at 130/80.  At home, he reported labile blood pressure with SBP 100-138 and DBP 70-72.  He was checking his blood pressure after work and at the end of each day.  He wondered if his blood pressure checks are influenced by the stress of his job.  He worked as a Nature conservation officer.  He had very rare and brief tachypalpitations. He noted DOE with heavy activity. He was drinking 3-4 beers per day. He was trying to reduce salt and was  sleeping 6h each night. He had hand pain with atorvastatin, switched to Crestor with improved sx.   10/23/2020 echo showed EF 45 to 50%, global hypokinesis, moderate LVE, G2 DD, mild MR, mild to moderate AR.  Seen 11/20/2020 and noted chest pain that started over the past few weeks and has been going on and off since that time.  Chest pain occurred mainly when waking up and getting ready for work and when exerting himself.  CP would occur on the left side of his chest.  It would usually last approximately 2-5 minutes before dissipating.  Associated symptoms included shortness of breath which also occurred when he was anxious.  He reported home BP 137/78 with SBP mainly in the 130s.  He is drinking 3-4 beers daily.  He had improved hand pain since switching to Crestor.  He continued to have bilateral trigger finger help to avoid surgery for as long as possible.  He was started on Imdur 30 mg daily.  Coronary CTA and chest CTA of the aorta was ordered.  Subsequent cCTA showed minimal to mild mixed nonobstructive CAD.  FFR was sent for proximal LAD stenosis with blooming artifact suspected.  CAC score 510, placing him in the 92nd percentile for age and sex matched control.  No aortic root dilation or thoracic aortic  aneurysm noted.  Aggressive risk factor modification recommended.  Subsequent FFR as below without any significant stenosis.   Today, 02/19/2021, he returns to clinic and notes that he has had resolution of his previous chest pain. He had a HA with Imdur and has thus discontinued it.  He continues to note shortness of breath with exertion, such as going up steps with most recent event occurring the middle of last week.  Usually his chest pain goes away within 30 minutes.  He also has shortness of breath with anxiety.  He frequently notes shortness of breath of hurrying.  He sometimes feels dizzy.  He has noted some intermittent swelling in his legs.  At home, BP usually about 120s over 70s.  He is  drinking 3-4 beers daily.  He reports medication compliance. He recently ate high salt, having some pizza rolls before his visit, and this may have contributed to his elevated BP today.  Home Medications    Current Outpatient Medications on File Prior to Visit  Medication Sig Dispense Refill   amLODipine (NORVASC) 10 MG tablet Take 1 tablet (10 mg total) by mouth daily. 30 tablet 5   ASPIRIN 81 PO Take by mouth daily.     ezetimibe (ZETIA) 10 MG tablet Take 1 tablet (10 mg total) by mouth daily. 90 tablet 3   Ibuprofen (MOTRIN PO) Take by mouth as needed. As needed for neck pain     Multiple Vitamin (MULTIVITAMIN PO) Take 1 tablet by mouth daily.     rosuvastatin (CRESTOR) 40 MG tablet Take 1 tablet (40 mg total) by mouth daily. 90 tablet 3   metoprolol tartrate (LOPRESSOR) 100 MG tablet Take 1 tablet (100 mg total) by mouth once for 1 dose. Take TWO hours prior to CT procedure 1 tablet 0   No current facility-administered medications on file prior to visit.    Review of Systems    He reports resolution of previous chest pain.  He has SOB/ dyspnea with anxiety, worrying, and exertion.  He reports some lower extremity edema and dizziness.  No further tachypalpitations.  No pnd, n, v, syncope, weight gain, or early satiety.   All other systems reviewed and are otherwise negative except as noted above.  Physica -l Exam    VS:  BP (!) 150/78 (BP Location: Left Arm, Patient Position: Sitting, Cuff Size: Normal)   Pulse 62   Ht '5\' 11"'$  (1.803 m)   Wt 196 lb (88.9 kg)   SpO2 98%   BMI 27.34 kg/m  , BMI Body mass index is 27.34 kg/m. GEN: Well nourished, well developed, in no acute distress. Joined by family.  HEENT: normal. Neck: Supple, no JVD, carotid bruits, or masses. Cardiac: RRR, 1/6 systolic and 1/4 diastolic murmurs on exam, rubs, or gallops. No clubbing, cyanosis, edema.  Radials/DP/PT 2+ and equal bilaterally.  Respiratory:  Respirations regular and unlabored, clear to  auscultation bilaterally. GI: Soft, nontender, nondistended, BS + x 4. MS: no deformity or atrophy. Bilateral trigger finger contraction noted of hands. Skin: warm and dry, no rash. Neuro:  Strength and sensation are intact. Psych: Normal affect.  Accessory Clinical Findings    ECG personally reviewed by me today -NSR, 62 bpm, LVH and IVCD with QRS 139m, ST depression and T wave inversion as seen in prior EKGs persists in leads II, III, aVF, changes also noted V1-V6 as seen in prior EKGs, IVCD, repolarization changes- no acute changes.  VITALS Reviewed today   Temp Readings from Last 3  Encounters:  11/20/20 98.3 F (36.8 C) (Temporal)  05/22/20 97.8 F (36.6 C) (Temporal)  02/14/20 (!) 97.4 F (36.3 C) (Temporal)   BP Readings from Last 3 Encounters:  02/19/21 (!) 150/78  01/22/21 121/81  11/20/20 134/80   Pulse Readings from Last 3 Encounters:  02/19/21 62  01/22/21 (!) 53  11/20/20 66    Wt Readings from Last 3 Encounters:  02/19/21 196 lb (88.9 kg)  11/20/20 194 lb (88 kg)  11/20/20 194 lb 1 oz (88 kg)     LABS  reviewed today   Lab Results  Component Value Date   WBC 9.6 02/19/2021   HGB 15.4 02/19/2021   HCT 44.4 02/19/2021   MCV 95.3 02/19/2021   PLT 186 02/19/2021   Lab Results  Component Value Date   CREATININE 0.79 02/19/2021   BUN 13 02/19/2021   NA 137 02/19/2021   K 4.0 02/19/2021   CL 103 02/19/2021   CO2 26 02/19/2021   Lab Results  Component Value Date   ALT 37 11/20/2020   AST 31 11/20/2020   ALKPHOS 53 11/20/2020   BILITOT 0.5 11/20/2020   Lab Results  Component Value Date   CHOL 175 11/20/2020   HDL 73 11/20/2020   LDLCALC 82 11/20/2020   LDLDIRECT 87.0 05/15/2020   TRIG 115 11/20/2020   CHOLHDL 2.4 11/20/2020    No results found for: HGBA1C Lab Results  Component Value Date   TSH 3.25 01/31/2020     STUDIES/PROCEDURES reviewed today   cCTA 01/22/21 1. 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 Will  send FFR given heavy proximal LAD stenosis, however, it does not appear obstructive. Blooming artifact is suspected. 2. Coronary calcium score of 510. This was 92nd percentile for age and sex matched control. 3. Normal coronary origin with right dominance. 4. No aortic root dilatation or thoracic aortic aneurysm noted. 5. Aggressive cardiovascular risk factor modification is recommended. FFR 1. Left Main:  No significant stenosis. FFR = 0.99 2. LAD: No significant stenosis. Proximal FFR = 0.96, Mid FFR = 0.93, Distal FFR = 0.88 3. LCX: No significant stenosis. Proximal FFR = 0.97, Distal FFR = 0.95 4. Ramus intermedius: Proximal FFR = 0.98, Distal FFR = 0.85 5. RCA: No significant stenosis. Proximal FFR = 0.99, Mid FFR = 0.92, Distal FFR = 0.92  CT 01/22/21 IMPRESSION: No evidence of aneurysmal disease of the thoracic aorta.  Echo 10/23/20  1. Left ventricular ejection fraction, by estimation, is 45 to 50%. The  left ventricle has mildly decreased function. The left ventricle  demonstrates global hypokinesis. The left ventricular internal cavity size  was moderately dilated. Left ventricular  diastolic parameters are consistent with Grade II diastolic dysfunction  (pseudonormalization). The average left ventricular global longitudinal  strain is -11.1 %. The global longitudinal strain is abnormal.   2. Right ventricular systolic function is normal. The right ventricular  size is normal.   3. The mitral valve is normal in structure. Mild mitral valve  regurgitation. No evidence of mitral stenosis.   4. The aortic valve is normal in structure, trileaflet. Aortic valve  regurgitation is mild to moderate. Mild aortic valve sclerosis is present,  with no evidence of aortic valve stenosis.   Echo 01/2020  1. Left ventricular ejection fraction, by estimation, is 60 to 65%. The  left ventricle has normal function. The left ventricle has no regional  wall motion abnormalities. The left  ventricular internal cavity size was  mildly dilated. Left  ventricular  diastolic parameters were normal.   2. Right ventricular systolic function is normal. The right ventricular  size is normal. Tricuspid regurgitation signal is inadequate for assessing  PA pressure.   3. The aortic valve is tricuspid. Aortic valve regurgitation is mild.   4. There is borderline dilatation of the aortic root measuring 36 mm.  Ascending aorta 2.8 cm   5. The inferior vena cava is dilated in size with >50% respiratory  variability, suggesting right atrial pressure of 8 mmHg.   Assessment & Plan    Chest pain History of Abnormal EKG -- Resolution of previous chest pain.  He continues to have significant exertional dyspnea.  History of abnormal EKG as seen at prior visits. Euvolemic on exam. Concern noted for DOE as anginal equivalent. Echo with reduced EF and LV global hypokinesis. Risk factors for cardiac ischemia include male, age greater than 62, hypertension, hyperlipidemia, and history of tobacco use. Given reduced EF and risk factors with ongoing sx of exertional dyspnea and recent cCTA, will proceed with LHC. Continue ASA, statin.  HA with Imdur.  Shared Decision Making/Informed Consent The risks [stroke (1 in 1000), death (1 in 1000), kidney failure [usually temporary] (1 in 500), bleeding (1 in 200), allergic reaction [possibly serious] (1 in 200)], benefits (diagnostic support and management of coronary artery disease) and alternatives of a cardiac catheterization were discussed in detail with Mr. Baranowski and he is willing to proceed.   Chronic combined systolic and diastolic heart failure Newly reduced EF and G2DD by echo 10/2020  -- Reports shortness of breath. Echo shows reduced EF and LV global hypokinesis by echo as above with G2DD. Further ischemic workup of reduced EF with cardiac CT as above and recommendation for LHC given ongoing sx. BP elevated today with pt report that he had some additional  salt this AM. Will start PRN Lasix '20mg'$  and Kcl tab 57mq. Scale provided today, so that he can provide daily weights, in addition to BP checks. Continue current Norvasc 10 mg daily. Unable to tolerate Imdur. At RTC, consider addition of ACE/ARB.  Escalation of GDMT at future clinic visits recommended with FWilder Glade+/- Spironolactone.  Essential hypertension, goal BP <130/80  -BP elevated in the setting of recently eating high salt foods. Continue amlodipine 10 mg daily. Did not tolerate Imdur. Recommend PRN lasix and Kcl tab as outlined above. Scale provided today. Low salt and fluid diet reviewed. Discussed importance of BP control with Ao root dilation with pt understanding.  Aortic root dilation, 3.7 cm (10/2020) --As seen on prior echos with most recent echo showing Ao root 3.7cm and Asc Ao 3.1cm.  Previous echo with aortic root dilation at 36 mm 01/2020.  Plan is for chest CT of the aorta at same time as the cardiac CT to get the measurement of the aorta.  Reviewed recommendations to avoid fluoroquinolones and heavy lifting, as well is control heart rate, blood pressure, and cholesterol.  Recommendations provided on AVS as well.  Continue ASA, statin.   Valvular dysfunction: AR, MR --Mild MR and mild to moderate AR seen on exam. Reviewed s/sx of worsening valvular dz with pt and recommendations for risk factor modification. HR/BP/LDL control. Continue to monitor with periodic echo.   HLD, LDL goal <70 Hypertriglyceridemia --04/2020 LDL 87.0 with triglycerides 284.0.  Continue Crestor 40 mg daily and Zetia.   Hand pain, bilateral, improved --Improved hand pain with transition from atorvastatin to Crestor 40 mg daily.    Alcohol use, current  --  Discussed the effect of alcohol on the heart with recommendation for complete cessation.  He currently drinks 3-4 beers per day.  History of tobacco use --Ongoing cessation recommended.  Medication changes: Lasix '20mg'$  and KCL tab 10MEq PRN . Labs  ordered: Pre cath CBC, BMET Studies / Imaging ordered: LHC  Future considerations: Escalation of GDMT Disposition: RTC after LHC  Arvil Chaco, PA-C 02/19/2021

## 2021-02-20 MED ORDER — SODIUM CHLORIDE 0.9% FLUSH: 3.0000 mL | Freq: Two times a day (BID) | INTRAVENOUS | Status: DC

## 2021-02-22 ENCOUNTER — Telehealth: Payer: Self-pay | Admitting: *Deleted

## 2021-02-22 NOTE — Telephone Encounter (Signed)
-----   Message from Arvil Chaco, PA-C sent at 02/21/2021 11:25 PM EDT ----- Labs show stable renal function / electrolytes and blood counts prior to catheterization.

## 2021-02-22 NOTE — Telephone Encounter (Signed)
Left voicemail message to call back for review of results.  

## 2021-02-23 NOTE — Telephone Encounter (Signed)
Left voicemail message to call back for results.  

## 2021-02-23 NOTE — Telephone Encounter (Signed)
No answer -no voice mail set up. 

## 2021-02-24 NOTE — Telephone Encounter (Signed)
Reviewed results with patients sister per release form. She verbalized understanding with no further questions.

## 2021-02-25 ENCOUNTER — Telehealth: Payer: Self-pay | Admitting: Internal Medicine

## 2021-02-25 NOTE — Telephone Encounter (Signed)
Patient wants to cancel upcoming cath and fu visit .  He states he wants to push these out for a while.

## 2021-02-26 NOTE — Telephone Encounter (Signed)
Spoke to pt's sister, Thayer Headings (DPR approved).  Thayer Headings states that pt wants to cancel cath procedure (8/16) and follow up d/t financial burdens.  Discussed financial concerns in length with Thayer Headings.  Offered information regarding setting up financial payments with [facility] billing. Also discussed financial assistance application and sending message to social work as well to see if any further assistance can be given to pt.  Thayer Headings appreciative of this information and does want financial application mailed and social work involved as well.  Thayer Headings states that pt would still like to r/s cath procedure "please push out about a month and pt needs this on a Friday as I am his only transportation."   Cath has been CANCELLED 8/16 with Dr. Saunders Revel and RESCHEDULED with Dr. Fletcher Anon 03/19/21 (per pt request for change of date). Reviewed instructions below and have also mailed copy of change of instructions to pt along with financial assistance application.  Will forward to Dr. Garen Lah to make aware of changes and to scheduling to set up follow up appt.    You are scheduled for a Cardiac Catheterization on Friday, September 2 with Dr. Kathlyn [place].  1. Please arrive at the [facility] at 6:30 AM (This time is one hour before your procedure to ensure your preparation). Free valet parking service is available.   Special note: Every effort is made to have your procedure done on time. Please understand that emergencies sometimes delay scheduled procedures.  2. Diet: Do not eat solid foods after midnight.  The patient may have clear liquids until 5am upon the day of the procedure.  3. Labs: BMET/ CBC completed 02/19/21. No further labs needed prior to procedure.   4. Medication instructions in preparation for your procedure:   Contrast Allergy: No  __XX__ HOLD Furosemide the morning of your procedure  On the morning of your procedure, take your Aspirin and any morning medicines NOT listed above.  You may  use sips of water.  5. Plan for one night stay--bring personal belongings. 6. Bring a current list of your medications and current insurance cards. 7. You MUST have a responsible person to drive you home. 8. Someone MUST be with you the first 24 hours after you arrive home or your discharge will be delayed. 9. Please wear clothes that are easy to get on and off and wear slip-on shoes.  Thank you for allowing [place] to care for you!   -- [facility]

## 2021-02-26 NOTE — Telephone Encounter (Signed)
Pt returned call. States he does not want to move forward with cath that was r/s on 9/2 at this time.  He requests to cancel this procedure and states he will call back when he is ready to r/s cath and follow up.  Cath has been cancelled.  Pt confirms he would still like me to mail financial assistance application.  Verified that I have mailed this and will update scheduling that pt will call back to schedule.

## 2021-03-02 DIAGNOSIS — R079 Chest pain, unspecified: Secondary | ICD-10-CM

## 2021-03-19 ENCOUNTER — Ambulatory Visit
Admission: RE | Admit: 2021-03-19 | Payer: BC Managed Care – PPO | Source: Home / Self Care | Admitting: Cardiovascular Disease

## 2021-03-19 ENCOUNTER — Encounter: Admission: RE | Payer: Self-pay | Source: Home / Self Care

## 2021-03-19 ENCOUNTER — Ambulatory Visit: Payer: BC Managed Care – PPO | Admitting: Cardiology

## 2021-03-19 SURGERY — LEFT HEART CATH AND CORONARY ANGIOGRAPHY
Anesthesia: Moderate Sedation | Laterality: Left

## 2021-05-15 ENCOUNTER — Other Ambulatory Visit: Payer: Self-pay | Admitting: Family Medicine

## 2021-05-15 DIAGNOSIS — I10 Essential (primary) hypertension: Secondary | ICD-10-CM

## 2021-05-15 DIAGNOSIS — E785 Hyperlipidemia, unspecified: Secondary | ICD-10-CM

## 2021-05-15 DIAGNOSIS — N400 Enlarged prostate without lower urinary tract symptoms: Secondary | ICD-10-CM

## 2021-05-21 ENCOUNTER — Other Ambulatory Visit: Payer: BC Managed Care – PPO

## 2021-05-28 ENCOUNTER — Encounter: Payer: BC Managed Care – PPO | Admitting: Family Medicine

## 2021-06-24 ENCOUNTER — Other Ambulatory Visit: Payer: Self-pay | Admitting: *Deleted

## 2021-06-28 MED ORDER — AMLODIPINE BESYLATE 10 MG PO TABS: 10.0000 mg | ORAL_TABLET | Freq: Every day | ORAL | 0 refills | Status: DC

## 2021-07-29 ENCOUNTER — Other Ambulatory Visit: Payer: Self-pay | Admitting: *Deleted

## 2021-07-29 MED ORDER — AMLODIPINE BESYLATE 10 MG PO TABS: 10.0000 mg | ORAL_TABLET | Freq: Every day | ORAL | 0 refills | Status: DC

## 2021-07-30 ENCOUNTER — Other Ambulatory Visit: Payer: Self-pay

## 2021-07-30 ENCOUNTER — Encounter: Payer: Self-pay | Admitting: Cardiology

## 2021-07-30 ENCOUNTER — Ambulatory Visit (INDEPENDENT_AMBULATORY_CARE_PROVIDER_SITE_OTHER): Payer: BC Managed Care – PPO | Admitting: Cardiology

## 2021-07-30 VITALS — BP 138/78 | HR 68 | Ht 71.0 in | Wt 198.8 lb

## 2021-07-30 DIAGNOSIS — E7800 Pure hypercholesterolemia, unspecified: Secondary | ICD-10-CM | POA: Diagnosis not present

## 2021-07-30 DIAGNOSIS — I2510 Atherosclerotic heart disease of native coronary artery without angina pectoris: Secondary | ICD-10-CM

## 2021-07-30 DIAGNOSIS — I10 Essential (primary) hypertension: Secondary | ICD-10-CM | POA: Diagnosis not present

## 2021-07-30 DIAGNOSIS — I5020 Unspecified systolic (congestive) heart failure: Secondary | ICD-10-CM | POA: Diagnosis not present

## 2021-07-30 MED ORDER — ROSUVASTATIN CALCIUM 20 MG PO TABS: 40.0000 mg | ORAL_TABLET | Freq: Every day | ORAL | 3 refills | Status: DC

## 2021-07-30 MED ORDER — CARVEDILOL 6.25 MG PO TABS: 6.2500 mg | ORAL_TABLET | Freq: Two times a day (BID) | ORAL | 3 refills | Status: DC

## 2021-07-30 MED ORDER — LOSARTAN POTASSIUM 25 MG PO TABS: 25.0000 mg | ORAL_TABLET | Freq: Every day | ORAL | 3 refills | Status: DC

## 2021-07-30 NOTE — Patient Instructions (Signed)
Medication Instructions:   Your physician has recommended you make the following change in your medication:    STOP taking your Furosemide (Lasix)  2.    STOP taking your Potassium  3.    STOP taking your Amolodipine (Norvasc)  4.    START taking Carvedilol (Coreg) 6.25 MG twice a day.  5.    START taking Losartan (Cozaar) 25 MG once a day.  6.    DECREASE your Rosuvastatin (Crestor) to 20 MG once a day.   *If you need a refill on your cardiac medications before your next appointment, please call your pharmacy*   Lab Work: None Ordered If you have labs (blood work) drawn today and your tests are completely normal, you will receive your results only by: [place] (if you have MyChart) OR A paper copy in the mail If you have any lab test that is abnormal or we need to change your treatment, we will call you to review the results.   Testing/Procedures: None ordered   Follow-Up: At [facility], you and your health needs are our priority.  As part of our continuing mission to provide you with exceptional heart care, we have created designated Provider Care Teams.  These Care Teams include your primary Cardiologist (physician) and Advanced Practice Providers (APPs -  Physician Assistants and Nurse Practitioners) who all work together to provide you with the care you need, when you need it.  We recommend signing up for the patient portal called "MyChart".  Sign up information is provided on this After Visit Summary.  MyChart is used to connect with patients for Virtual Visits (Telemedicine).  Patients are able to view lab/test results, encounter notes, upcoming appointments, etc.  Non-urgent messages can be sent to your provider as well.   To learn more about what you can do with MyChart, go to [email or web address].    Your next appointment:   5 week(s)  The format for your next appointment:   In Person  Provider:    Kate Sable, MD    Other  Instructions

## 2021-07-30 NOTE — Progress Notes (Signed)
Cardiology Office Note:    Date:  07/30/2021   ID:  Caleb Spencer, DOB 1960-11-17, MRN [id number]  PCP:  Caleb Bush, MD  [facility] Cardiologist:  Caleb Sable, MD  [place] Electrophysiologist:  None   Referring MD: Caleb Bush, MD   No chief complaint on file.   History of Present Illness:    Caleb Spencer is a 61 y.o. male with a hx of CAD (mild prox LAD disease, calcium score 510 on CCTA 02/2021),  hypertension, hyperlipidemia who presents for follow-up.    Previously seen in the office for chest pain.  Last seen by myself in 2021.  He has had symptoms of chest pain associated with exertion.  Underwent a coronary CTA last year 01/2021 showing mild nonobstructive LAD disease, no significant stenosis on CT FFR.  Due to persistent angina, left heart cath was planned.  Due to cost of heart cath, patient declined procedure.  He takes amlodipine only for BP.  Also takes Crestor 40 mg daily, he states makes his hands cramp up.  He currently takes Crestor every other day with some improvement in hand cramping.  He is a Nature conservation officer, exerts himself fairly frequently, with associated chest pain which he rates as 3 out of 10.  He denies edema.    Prior notes Coronary CTA 01/2021 mild LAD disease, calcium score 510, FFR CT no significant stenosis Echo 10/2020 EF 45 to 50% mild to moderate AI Echocardiogram on 02/14/2020 showed normal systolic and diastolic function, EF 35%, mild AI.  No LVH noted on echocardiogram.  Past Medical History:  Diagnosis Date   History of smoking    Kidney stone    Left groin hernia    working on finances to be able to do    Past Surgical History:  Procedure Laterality Date   INGUINAL HERNIA REPAIR  1987   Left    Current Medications: Current Meds  Medication Sig   ASPIRIN 81 PO Take by mouth daily.   carvedilol (COREG) 6.25 MG tablet Take 1 tablet (6.25 mg total) by mouth 2 (two) times daily.   ezetimibe (ZETIA) 10 MG  tablet Take 1 tablet (10 mg total) by mouth daily.   Ibuprofen (MOTRIN PO) Take by mouth as needed. As needed for neck pain   losartan (COZAAR) 25 MG tablet Take 1 tablet (25 mg total) by mouth daily.   Multiple Vitamin (MULTIVITAMIN PO) Take 1 tablet by mouth daily.   [DISCONTINUED] amLODipine (NORVASC) 10 MG tablet Take 1 tablet (10 mg total) by mouth daily. PLEASE CALL OFFICE TO SCHEDULE AN APPOINTMENT [phone]   [DISCONTINUED] furosemide (LASIX) 20 MG tablet Take 1 tablet (20 mg total) by mouth as needed (As needed for weight gain 3 pounds overnight or 5 pounds in one week.).   [DISCONTINUED] metoprolol tartrate (LOPRESSOR) 100 MG tablet Take 1 tablet (100 mg total) by mouth once for 1 dose. Take TWO hours prior to CT procedure   [DISCONTINUED] potassium chloride (KLOR-CON) 10 MEQ tablet Take 1 tablet (10 mEq total) by mouth as needed (Take 1 tablet when you take Furosemide (Lasix)).   [DISCONTINUED] rosuvastatin (CRESTOR) 40 MG tablet Take 1 tablet (40 mg total) by mouth daily.   Current Facility-Administered Medications for the 07/30/21 encounter (Office Visit) with Caleb Sable, MD  Medication   sodium chloride flush (NS) 0.9 % injection 3 mL     Allergies:   Imdur [isosorbide nitrate]   Social History   Socioeconomic History   Marital  status: Single    Spouse name: Not on file   Number of children: Not on file   Years of education: 10th   Highest education level: Not on file  Occupational History   Occupation: fireproofing and Radiographer, therapeutic: [employer]  Tobacco Use   Smoking status: Former    Types: Cigarettes   Smokeless tobacco: Never  Vaping Use   Vaping Use: Never used  Substance and Sexual Activity   Alcohol use: Yes    Alcohol/week: 0.0 standard drinks    Comment: 6 pack/weekly-possibly a little more   Drug use: No   Sexual activity: Not on file  Other Topics Concern   Not on file  Social History Narrative   Lives alone, no pets    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 There is no history of Stroke.  ROS:   Please see the history of present illness.     All other systems reviewed and are negative.  EKGs/Labs/Other Studies Reviewed:    The following studies were reviewed today:   EKG:  EKG is  ordered today.  The ekg ordered today demonstrates normal sinus rhythm.  Recent Labs: 11/20/2020: ALT 37 02/19/2021: BUN 13; Creatinine, Ser 0.79; Hemoglobin 15.4; Platelets 186; Potassium 4.0; Sodium 137  Recent Lipid Panel    Component Value Date/Time   CHOL 175 11/20/2020 1231   TRIG 115 11/20/2020 1231   HDL 73 11/20/2020 1231   CHOLHDL 2.4 11/20/2020 1231   CHOLHDL 4 05/15/2020 0756   VLDL 56.8 (H) 05/15/2020 0756   LDLCALC 82 11/20/2020 1231   LDLDIRECT 87.0 05/15/2020 0756    Physical Exam:    VS:  BP 138/78    Pulse 68    Ht 5\' 11"  (1.803 m)    Wt 198 lb 12.8 oz (90.2 kg)    SpO2 97%    BMI 27.73 kg/m     Wt Readings from Last 3 Encounters:  07/30/21 198 lb 12.8 oz (90.2 kg)  02/19/21 196 lb (88.9 kg)  11/20/20 194 lb (88 kg)     GEN:  Well nourished, well developed in no acute distress HEENT: Normal NECK: No JVD; No carotid bruits CARDIAC: RRR, no murmurs, rubs, gallops RESPIRATORY:  Clear to auscultation without rales, wheezing or rhonchi  ABDOMEN: Soft, non-tender, non-distended MUSCULOSKELETAL:  No edema; No deformity  SKIN: Warm and dry NEUROLOGIC:  Alert and oriented x 3 PSYCHIATRIC:  Normal affect    ASSESSMENT:    1. Coronary artery disease involving native coronary artery of native heart, unspecified whether angina present   2. HFrEF (heart failure with reduced ejection fraction) ([place])   3. Primary hypertension   4. Pure hypercholesterolemia     PLAN:    In order of problems listed above:  CAD, mild calcified proximal LAD disease.  CT FFR with no evidence for ischemia.  Start Coreg 6.25 twice daily for antianginal benefit.  Continue aspirin, decrease Crestor to 20 mg due to myalgias.  Mildly reduced EF of 45%.  Start losartan 25 mg daily, start Coreg 6.25 mg twice daily.  Patient is euvolemic. Hypertension, Coreg and losartan as above. Hyperlipidemia, reduce Crestor to 20 mg daily.  If myalgias persist, consider PCSK9.  Continue Zetia 10 mg daily.  Follow-up in 6 weeks.  Total encounter time 40 minutes  Greater than 50% was spent in counseling and coordination of care with the patient   Medication Adjustments/Labs and Tests Ordered: Current medicines are reviewed at length with the patient today.  Concerns regarding medicines are outlined above.  Orders Placed This Encounter  Procedures   EKG 12-Lead   Meds ordered this encounter  Medications   rosuvastatin (CRESTOR) 20 MG tablet    Sig: Take 2 tablets (40 mg total) by mouth daily.    Dispense:  30 tablet    Refill:  3   carvedilol (COREG) 6.25 MG tablet    Sig: Take 1 tablet (6.25 mg total) by mouth 2 (two) times daily.    Dispense:  60 tablet    Refill:  3   losartan (COZAAR) 25 MG tablet    Sig: Take 1 tablet (25 mg total) by mouth daily.    Dispense:  30 tablet    Refill:  3    Patient Instructions  Medication Instructions:   Your physician has recommended you make the following change in your medication:    STOP taking your Furosemide (Lasix)  2.    STOP taking your Potassium  3.    STOP taking your Amolodipine (Norvasc)  4.    START taking Carvedilol (Coreg) 6.25 MG twice a day.  5.    START  taking Losartan (Cozaar) 25 MG once a day.  6.    DECREASE your Rosuvastatin (Crestor) to 20 MG once a day.   *If you need a refill on your cardiac medications before your next appointment, please call your pharmacy*   Lab Work: None Ordered If you have labs (blood work) drawn today and your tests are completely normal, you will receive your results only by: [place] (if you have MyChart) OR A paper copy in the mail If you have any lab test that is abnormal or we need to change your treatment, we will call you to review the results.   Testing/Procedures: None ordered   Follow-Up: At [facility], you and your health needs are our priority.  As part of our continuing mission to provide you with exceptional heart care, we have created designated Provider Care Teams.  These Care Teams include your primary Cardiologist (physician) and Advanced Practice Providers (APPs -  Physician Assistants and Nurse Practitioners) who all work together to provide you with the care you need, when you need it.  We recommend signing up for the patient portal called "MyChart".  Sign up information is provided on this After Visit Summary.  MyChart is used to connect with patients for Virtual Visits (Telemedicine).  Patients are able to view lab/test results, encounter notes, upcoming appointments, etc.  Non-urgent messages can be sent to your provider as well.   To learn more about what you can do with MyChart, go to [email or web address].    Your next appointment:   5 week(s)  The format for your next appointment:   In Person  Provider:    Kate Sable, MD    Other Instructions     Signed, Caleb Sable, MD  07/30/2021 12:46 PM    [place]

## 2021-08-06 ENCOUNTER — Other Ambulatory Visit: Payer: BC Managed Care – PPO

## 2021-08-13 ENCOUNTER — Encounter: Payer: BC Managed Care – PPO | Admitting: Family Medicine

## 2021-09-03 ENCOUNTER — Ambulatory Visit (INDEPENDENT_AMBULATORY_CARE_PROVIDER_SITE_OTHER): Payer: BC Managed Care – PPO | Admitting: Cardiology

## 2021-09-03 ENCOUNTER — Encounter: Payer: Self-pay | Admitting: Cardiology

## 2021-09-03 ENCOUNTER — Other Ambulatory Visit: Payer: Self-pay

## 2021-09-03 VITALS — BP 120/78 | HR 78 | Ht 71.0 in | Wt 190.0 lb

## 2021-09-03 DIAGNOSIS — E7800 Pure hypercholesterolemia, unspecified: Secondary | ICD-10-CM | POA: Diagnosis not present

## 2021-09-03 DIAGNOSIS — I10 Essential (primary) hypertension: Secondary | ICD-10-CM | POA: Diagnosis not present

## 2021-09-03 DIAGNOSIS — I2510 Atherosclerotic heart disease of native coronary artery without angina pectoris: Secondary | ICD-10-CM

## 2021-09-03 DIAGNOSIS — R9430 Abnormal result of cardiovascular function study, unspecified: Secondary | ICD-10-CM | POA: Diagnosis not present

## 2021-09-03 MED ORDER — PRAVASTATIN SODIUM 40 MG PO TABS: 40.0000 mg | ORAL_TABLET | Freq: Every evening | ORAL | 5 refills | Status: DC

## 2021-09-03 MED ORDER — ASPIRIN EC 81 MG PO TBEC: 81.0000 mg | DELAYED_RELEASE_TABLET | Freq: Every day | ORAL | 3 refills | Status: AC

## 2021-09-03 NOTE — Patient Instructions (Signed)
Medication Instructions:   Your physician has recommended you make the following change in your medication:    STOP taking your Rosuvastatin (Crestor).  2.    START taking Pravastatin (Pravachol)  3.    TAKE your Aspirin 81 MG once a day.   *If you need a refill on your cardiac medications before your next appointment, please call your pharmacy*   Lab Work: None ordered If you have labs (blood work) drawn today and your tests are completely normal, you will receive your results only by: [place] (if you have MyChart) OR A paper copy in the mail If you have any lab test that is abnormal or we need to change your treatment, we will call you to review the results.   Testing/Procedures: None ordered   Follow-Up: At [facility], you and your health needs are our priority.  As part of our continuing mission to provide you with exceptional heart care, we have created designated Provider Care Teams.  These Care Teams include your primary Cardiologist (physician) and Advanced Practice Providers (APPs -  Physician Assistants and Nurse Practitioners) who all work together to provide you with the care you need, when you need it.  We recommend signing up for the patient portal called "MyChart".  Sign up information is provided on this After Visit Summary.  MyChart is used to connect with patients for Virtual Visits (Telemedicine).  Patients are able to view lab/test results, encounter notes, upcoming appointments, etc.  Non-urgent messages can be sent to your provider as well.   To learn more about what you can do with MyChart, go to [email or web address].    Your next appointment:   6 month(s)  The format for your next appointment:   In Person  Provider:   You may see Kate Sable, MD or one of the following Advanced Practice Providers on your designated Care Team:   Murray Hodgkins, NP Christell Faith, PA-C Cadence Kathlen Mody, [place]    Other Instructions

## 2021-09-03 NOTE — Progress Notes (Signed)
Cardiology Office Note:    Date:  09/03/2021   ID:  Caleb Spencer, DOB October 19, 1960, MRN [id number]  PCP:  Ria Bush, MD  [facility] Cardiologist:  Kate Sable, MD  [place] Electrophysiologist:  None   Referring MD: Ria Bush, MD   Chief Complaint  Patient presents with   Other    5 week follow up -- Meds reviewed verbally with patient.     History of Present Illness:    Caleb Spencer is a 61 y.o. male with a hx of CAD (mild prox LAD disease, calcium score 510 on CCTA 02/2021),  hypertension, hyperlipidemia who presents for follow-up.    Patient being seen for nonobstructive CAD, hyperlipidemia.  Started on Coreg after last visit for antianginal benefit, he states feeling much better since starting Coreg.  He has occasional cramping with taking Crestor 40 mg daily.  He otherwise feels well, denies chest pain or shortness of breath.   Prior notes Coronary CTA 01/2021 mild LAD disease, calcium score 510, FFR CT no significant stenosis Echo 10/2020 EF 45 to 50% mild to moderate AI Echocardiogram on 02/14/2020 showed normal systolic and diastolic function, EF 65%, mild AI.  No LVH noted on echocardiogram.  Past Medical History:  Diagnosis Date   History of smoking    Kidney stone    Left groin hernia    working on finances to be able to do    Past Surgical History:  Procedure Laterality Date   INGUINAL HERNIA REPAIR  1987   Left    Current Medications: Current Meds  Medication Sig   aspirin EC 81 MG tablet Take 1 tablet (81 mg total) by mouth daily. Swallow whole.   carvedilol (COREG) 6.25 MG tablet Take 1 tablet (6.25 mg total) by mouth 2 (two) times daily.   ezetimibe (ZETIA) 10 MG tablet Take 1 tablet (10 mg total) by mouth daily.   Ibuprofen (MOTRIN PO) Take by mouth as needed. As needed for neck pain   losartan (COZAAR) 25 MG tablet Take 1 tablet (25 mg total) by mouth daily.   Multiple Vitamin (MULTIVITAMIN PO) Take 1 tablet by mouth  daily.   pravastatin (PRAVACHOL) 40 MG tablet Take 1 tablet (40 mg total) by mouth every evening.   [DISCONTINUED] ASPIRIN 81 PO Take by mouth daily.   [DISCONTINUED] rosuvastatin (CRESTOR) 20 MG tablet Take 2 tablets (40 mg total) by mouth daily.   Current Facility-Administered Medications for the 09/03/21 encounter (Office Visit) with Kate Sable, MD  Medication   sodium chloride flush (NS) 0.9 % injection 3 mL     Allergies:   Imdur [isosorbide nitrate] and Crestor [rosuvastatin]   Social History   Socioeconomic History   Marital status: Single    Spouse name: Not on file   Number of children: Not on file   Years of education: 10th   Highest education level: Not on file  Occupational History   Occupation: fireproofing and Radiographer, therapeutic: [employer]  Tobacco Use   Smoking status: Former    Types: Cigarettes   Smokeless tobacco: Never  Vaping Use   Vaping Use: Never used  Substance and Sexual Activity   Alcohol use: Yes    Alcohol/week: 0.0 standard drinks    Comment: 6 pack/weekly-possibly a little more   Drug use: No   Sexual activity: Not on file  Other Topics Concern   Not on file  Social History Narrative   Lives alone, no pets  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 There is no history of Stroke.  ROS:   Please see the history of present illness.     All other systems reviewed and are negative.  EKGs/Labs/Other Studies Reviewed:    The following  studies were reviewed today:   EKG:  EKG is  ordered today.  The ekg ordered today demonstrates normal sinus rhythm, LVH.  Recent Labs: 11/20/2020: ALT 37 02/19/2021: BUN 13; Creatinine, Ser 0.79; Hemoglobin 15.4; Platelets 186; Potassium 4.0; Sodium 137  Recent Lipid Panel    Component Value Date/Time   CHOL 175 11/20/2020 1231   TRIG 115 11/20/2020 1231   HDL 73 11/20/2020 1231   CHOLHDL 2.4 11/20/2020 1231   CHOLHDL 4 05/15/2020 0756   VLDL 56.8 (H) 05/15/2020 0756   LDLCALC 82 11/20/2020 1231   LDLDIRECT 87.0 05/15/2020 0756    Physical Exam:    VS:  BP 120/78 (BP Location: Left Arm, Patient Position: Sitting, Cuff Size: Normal)    Pulse 78    Ht 5\' 11"  (1.803 m)    Wt 190 lb (86.2 kg)    SpO2 96%    BMI 26.50 kg/m     Wt Readings from Last 3 Encounters:  09/03/21 190 lb (86.2 kg)  07/30/21 198 lb 12.8 oz (90.2 kg)  02/19/21 196 lb (88.9 kg)     GEN:  Well nourished, well developed in no acute distress HEENT: Normal NECK: No JVD; No carotid bruits CARDIAC: RRR, no murmurs, rubs, gallops RESPIRATORY:  Clear to auscultation without rales, wheezing or rhonchi  ABDOMEN: Soft, non-tender, non-distended MUSCULOSKELETAL:  No edema; No deformity  SKIN: Warm and dry NEUROLOGIC:  Alert and oriented x 3 PSYCHIATRIC:  Normal affect   ASSESSMENT:    1. Coronary artery disease involving native coronary artery of native heart, unspecified whether angina present   2. Ejection fraction < 50%   3. Primary hypertension   4. Pure hypercholesterolemia    PLAN:    In order of problems listed above:  CAD, mild calcified proximal LAD disease.  CT FFR with no evidence for ischemia.  Chest pain resolved with beta-blocker.  Continue Coreg 6.25 twice daily for antianginal benefit.  Continue aspirin.  Stop Crestor, start Pravachol 40 mg daily due to myalgias. Mildly reduced EF of 45%.  Continue Coreg 6.25 mg twice daily, losartan 25 mg daily.  Patient is euvolemic. Hypertension, BP  controlled Coreg and losartan as above. Hyperlipidemia, myalgias with Crestor.  Start Pravachol 40 mg daily.  Follow-up in 6 months  Total encounter time 40 minutes  Greater than 50% was spent in counseling and coordination of care with the patient   Medication Adjustments/Labs and Tests Ordered: Current medicines are reviewed at length with the patient today.  Concerns regarding medicines are outlined above.  Orders Placed This Encounter  Procedures   EKG 12-Lead   Meds ordered this encounter  Medications   aspirin EC 81 MG tablet    Sig: Take 1  tablet (81 mg total) by mouth daily. Swallow whole.    Dispense:  90 tablet    Refill:  3   pravastatin (PRAVACHOL) 40 MG tablet    Sig: Take 1 tablet (40 mg total) by mouth every evening.    Dispense:  30 tablet    Refill:  5    Patient Instructions  Medication Instructions:   Your physician has recommended you make the following change in your medication:    STOP taking your Rosuvastatin (Crestor).  2.    START taking Pravastatin (Pravachol)  3.    TAKE your Aspirin 81 MG once a day.   *If you need a refill on your cardiac medications before your next appointment, please call your pharmacy*   Lab Work: None ordered If you have labs (blood work) drawn today and your tests are completely normal, you will receive your results only by: [place] (if you have MyChart) OR A paper copy in the mail If you have any lab test that is abnormal or we need to change your treatment, we will call you to review the results.   Testing/Procedures: None ordered   Follow-Up: At [facility], you and your health needs are our priority.  As part of our continuing mission to provide you with exceptional heart care, we have created designated Provider Care Teams.  These Care Teams include your primary Cardiologist (physician) and Advanced Practice Providers (APPs -  Physician Assistants and Nurse Practitioners) who all work together  to provide you with the care you need, when you need it.  We recommend signing up for the patient portal called "MyChart".  Sign up information is provided on this After Visit Summary.  MyChart is used to connect with patients for Virtual Visits (Telemedicine).  Patients are able to view lab/test results, encounter notes, upcoming appointments, etc.  Non-urgent messages can be sent to your provider as well.   To learn more about what you can do with MyChart, go to [email or web address].    Your next appointment:   6 month(s)  The format for your next appointment:   In Person  Provider:   You may see Kate Sable, MD or one of the following Advanced Practice Providers on your designated Care Team:   Murray Hodgkins, NP Christell Faith, PA-C Cadence Kathlen Mody, [place]    Other Instructions     Signed, Kate Sable, MD  09/03/2021 10:39 AM    [place]

## 2021-11-18 ENCOUNTER — Other Ambulatory Visit: Payer: Self-pay

## 2021-11-18 MED ORDER — LOSARTAN POTASSIUM 25 MG PO TABS: 25.0000 mg | ORAL_TABLET | Freq: Every day | ORAL | 2 refills | Status: DC

## 2021-12-14 ENCOUNTER — Other Ambulatory Visit: Payer: Self-pay | Admitting: *Deleted

## 2021-12-14 MED ORDER — CARVEDILOL 6.25 MG PO TABS: 6.2500 mg | ORAL_TABLET | Freq: Two times a day (BID) | ORAL | 2 refills | Status: DC

## 2022-01-14 ENCOUNTER — Encounter: Payer: Self-pay | Admitting: Family Medicine

## 2022-01-14 ENCOUNTER — Ambulatory Visit (INDEPENDENT_AMBULATORY_CARE_PROVIDER_SITE_OTHER): Payer: BC Managed Care – PPO | Admitting: Family Medicine

## 2022-01-14 VITALS — BP 134/72 | HR 63 | Ht 71.0 in | Wt 192.6 lb

## 2022-01-14 DIAGNOSIS — I38 Endocarditis, valve unspecified: Secondary | ICD-10-CM

## 2022-01-14 DIAGNOSIS — Z23 Encounter for immunization: Secondary | ICD-10-CM | POA: Diagnosis not present

## 2022-01-14 DIAGNOSIS — Z789 Other specified health status: Secondary | ICD-10-CM

## 2022-01-14 DIAGNOSIS — N400 Enlarged prostate without lower urinary tract symptoms: Secondary | ICD-10-CM

## 2022-01-14 DIAGNOSIS — K4090 Unilateral inguinal hernia, without obstruction or gangrene, not specified as recurrent: Secondary | ICD-10-CM

## 2022-01-14 DIAGNOSIS — Z0001 Encounter for general adult medical examination with abnormal findings: Secondary | ICD-10-CM | POA: Diagnosis not present

## 2022-01-14 DIAGNOSIS — I10 Essential (primary) hypertension: Secondary | ICD-10-CM

## 2022-01-14 DIAGNOSIS — Z1211 Encounter for screening for malignant neoplasm of colon: Secondary | ICD-10-CM | POA: Diagnosis not present

## 2022-01-14 DIAGNOSIS — R252 Cramp and spasm: Secondary | ICD-10-CM

## 2022-01-14 DIAGNOSIS — E785 Hyperlipidemia, unspecified: Secondary | ICD-10-CM | POA: Diagnosis not present

## 2022-01-14 DIAGNOSIS — I77810 Thoracic aortic ectasia: Secondary | ICD-10-CM

## 2022-01-14 DIAGNOSIS — I5020 Unspecified systolic (congestive) heart failure: Secondary | ICD-10-CM

## 2022-01-14 DIAGNOSIS — Z87891 Personal history of nicotine dependence: Secondary | ICD-10-CM

## 2022-01-14 LAB — MICROALBUMIN / CREATININE URINE RATIO
Creatinine,U: 156.3 mg/dL
Microalb Creat Ratio: 1.1 mg/g (ref 0.0–30.0)
Microalb, Ur: 1.8 mg/dL (ref 0.0–1.9)

## 2022-01-14 LAB — COMPREHENSIVE METABOLIC PANEL
ALT: 21 U/L (ref 0–53)
AST: 27 U/L (ref 0–37)
Albumin: 4.3 g/dL (ref 3.5–5.2)
Alkaline Phosphatase: 40 U/L (ref 39–117)
BUN: 16 mg/dL (ref 6–23)
CO2: 30 mEq/L (ref 19–32)
Calcium: 9.3 mg/dL (ref 8.4–10.5)
Chloride: 103 mEq/L (ref 96–112)
Creatinine, Ser: 0.92 mg/dL (ref 0.40–1.50)
GFR: 90.5 mL/min (ref >60.00)
Glucose, Bld: 105 mg/dL — ABNORMAL HIGH (ref 70–99)
Potassium: 4.2 mEq/L (ref 3.5–5.1)
Sodium: 138 mEq/L (ref 135–145)
Total Bilirubin: 0.4 mg/dL (ref 0.2–1.2)
Total Protein: 7.2 g/dL (ref 6.0–8.3)

## 2022-01-14 LAB — LIPID PANEL
Cholesterol: 218 mg/dL — ABNORMAL HIGH (ref 0–200)
HDL: 61.8 mg/dL (ref >39.00)
LDL Cholesterol: 136 mg/dL — ABNORMAL HIGH (ref 0–99)
NonHDL: 156.06
Total CHOL/HDL Ratio: 4
Triglycerides: 100 mg/dL (ref 0.0–149.0)
VLDL: 20 mg/dL (ref 0.0–40.0)

## 2022-01-14 LAB — PSA: PSA: 0.84 ng/mL (ref 0.10–4.00)

## 2022-01-14 MED ORDER — COENZYME Q-10 100 MG PO CAPS: 100.0000 mg | ORAL_CAPSULE | Freq: Every day | ORAL | Status: DC

## 2022-01-14 NOTE — Assessment & Plan Note (Signed)
Update PSA 

## 2022-01-14 NOTE — Assessment & Plan Note (Signed)
Appreciate cards care. 

## 2022-01-14 NOTE — Assessment & Plan Note (Signed)
Chronic, stable. Continue current regimen. Appreciate cards care.

## 2022-01-14 NOTE — Assessment & Plan Note (Signed)
Encouraged continued cessation efforts.

## 2022-01-14 NOTE — Assessment & Plan Note (Addendum)
Chronic on pravastatin. Update FLP. Appreciate cards care.  The 10-year ASCVD risk score (Arnett DK, et al., 2019) is: 13.7%   Values used to calculate the score:     Age: 61 years     Sex: Male     Is Non-Hispanic African American: No     Diabetic: Yes     Tobacco smoker: No     Systolic Blood Pressure: 195 mmHg     Is BP treated: Yes     HDL Cholesterol: 73 mg/dL     Total Cholesterol: 175 mg/dL

## 2022-01-14 NOTE — Assessment & Plan Note (Addendum)
Large. No pain. Desires surgical evaluation this year.  Requests new referral to gen surgery.  Will complete hernia treatment prior to colonoscopy referral.

## 2022-01-14 NOTE — Assessment & Plan Note (Signed)
Remains abstinent. Not eligible for lung cancer screening program.

## 2022-01-14 NOTE — Patient Instructions (Addendum)
Labs today.  Pass by lab to pick up stool kit.  [id number]-[id number] today (pneumonia shot).  Work on getting hernia repaired this year.  Try CoEnzyme Q10 supplement 50-$RemoveBeforeDEI'100mg'jkAayawPtEwkBMFY$  daily for muscle cramps.  Good to see you today. Return as needed or in 1 year for next physical.   Health Maintenance, Male Adopting a healthy lifestyle and getting preventive care are important in promoting health and wellness. Ask your health care provider about: The right schedule for you to have regular tests and exams. Things you can do on your own to prevent diseases and keep yourself healthy. What should I know about diet, weight, and exercise? Eat a healthy diet  Eat a diet that includes plenty of vegetables, fruits, low-fat dairy products, and lean protein. Do not eat a lot of foods that are high in solid fats, added sugars, or sodium. Maintain a healthy weight Body mass index (BMI) is a measurement that can be used to identify possible weight problems. It estimates body fat based on height and weight. Your health care provider can help determine your BMI and help you achieve or maintain a healthy weight. Get regular exercise Get regular exercise. This is one of the most important things you can do for your health. Most adults should: Exercise for at least 150 minutes each week. The exercise should increase your heart rate and make you sweat (moderate-intensity exercise). Do strengthening exercises at least twice a week. This is in addition to the moderate-intensity exercise. Spend less time sitting. Even light physical activity can be beneficial. Watch cholesterol and blood lipids Have your blood tested for lipids and cholesterol at 60 years of age, then have this test every 5 years. You may need to have your cholesterol levels checked more often if: Your lipid or cholesterol levels are high. You are older than 60 years of age. You are at high risk for heart disease. What should I know about cancer  screening? Many types of cancers can be detected early and may often be prevented. Depending on your health history and family history, you may need to have cancer screening at various ages. This may include screening for: Colorectal cancer. Prostate cancer. Skin cancer. Lung cancer. What should I know about heart disease, diabetes, and high blood pressure? Blood pressure and heart disease High blood pressure causes heart disease and increases the risk of stroke. This is more likely to develop in people who have high blood pressure readings or are overweight. Talk with your health care provider about your target blood pressure readings. Have your blood pressure checked: Every 3-5 years if you are 28-57 years of age. Every year if you are 35 years old or older. If you are between the ages of 62 and 86 and are a current or former smoker, ask your health care provider if you should have a one-time screening for abdominal aortic aneurysm (AAA). Diabetes Have regular diabetes screenings. This checks your fasting blood sugar level. Have the screening done: Once every three years after age 63 if you are at a normal weight and have a low risk for diabetes. More often and at a younger age if you are overweight or have a high risk for diabetes. What should I know about preventing infection? Hepatitis B If you have a higher risk for hepatitis B, you should be screened for this virus. Talk with your health care provider to find out if you are at risk for hepatitis B infection. Hepatitis C Blood testing is recommended for: Everyone  born from [birth year] through 1964. Anyone with known risk factors for hepatitis C. Sexually transmitted infections (STIs) You should be screened each year for STIs, including gonorrhea and chlamydia, if: You are sexually active and are younger than 60 years of age. You are older than 60 years of age and your health care provider tells you that you are at risk for this type of  infection. Your sexual activity has changed since you were last screened, and you are at increased risk for chlamydia or gonorrhea. Ask your health care provider if you are at risk. Ask your health care provider about whether you are at high risk for HIV. Your health care provider may recommend a prescription medicine to help prevent HIV infection. If you choose to take medicine to prevent HIV, you should first get tested for HIV. You should then be tested every 3 months for as long as you are taking the medicine. Follow these instructions at home: Alcohol use Do not drink alcohol if your health care provider tells you not to drink. If you drink alcohol: Limit how much you have to 0-2 drinks a day. Know how much alcohol is in your drink. In the [place] one drink equals one 12 oz bottle of beer (355 mL), one 5 oz glass of wine (148 mL), or one 1 oz glass of hard liquor (44 mL). Lifestyle Do not use any products that contain nicotine or tobacco. These products include cigarettes, chewing tobacco, and vaping devices, such as e-cigarettes. If you need help quitting, ask your health care provider. Do not use street drugs. Do not share needles. Ask your health care provider for help if you need support or information about quitting drugs. General instructions Schedule regular health, dental, and eye exams. Stay current with your vaccines. Tell your health care provider if: You often feel depressed. You have ever been abused or do not feel safe at home. Summary Adopting a healthy lifestyle and getting preventive care are important in promoting health and wellness. Follow your health care provider's instructions about healthy diet, exercising, and getting tested or screened for diseases. Follow your health care provider's instructions on monitoring your cholesterol and blood pressure. This information is not intended to replace advice given to you by your health care provider. Make sure you discuss  any questions you have with your health care provider. Document Revised: 11/23/2020 Document Reviewed: 11/23/2020 Elsevier Patient Education  [place].

## 2022-01-14 NOTE — Assessment & Plan Note (Addendum)
Preventative protocols reviewed and updated unless pt declined. Discussed healthy diet and lifestyle.  Interested in colonoscopy however will start with iFOB given large R inguinal hernia.  Notes some depressed mood due to work stressors. PHQ2 score = 1. Discussed importance of self care.

## 2022-01-14 NOTE — Assessment & Plan Note (Signed)
?  statin related - trial CoQ10 supplement.

## 2022-01-14 NOTE — Progress Notes (Signed)
Patient ID: Caleb Spencer, male    DOB: 1961/03/12, 61 y.o.   MRN: [id number]  This visit was conducted in person.  BP 134/72   Pulse 63   Ht 5\' 11"  (1.803 m)   Wt 192 lb 9.6 oz (87.4 kg)   SpO2 98%   BMI 26.86 kg/m    CC: CPE Subjective:   HPI: Caleb Spencer is a 61 y.o. male presenting on 01/14/2022 for Annual Exam (Physical ,  no concerns )   Sees cardiology for CHF with decreased EF 45-50%, global LV hypokinesis, and diastolic dysfunction, mild-mod AR, mild MR by echo. Coronary CTA 01/2021 showed mild LAD with calcium score of 510. Now on coreg in place of amlodipine for antianginal effect, continues losartan 25mg  daily. Has been off aspirin due to increased bruising - advised restart this  Needs R inguinal hernia repair. Avoids straining, heavy lifting.   Preventative: Colon cancer screening - discussed, would like colonoscopy - however in h/o large inguinal hernia, will start with iFOB and proceed with colonoscopy after hernia repair Prostate cancer screening - discussed. Would like PSA yearly. No nocturia, weakening stream.  Lung cancer screening - not eligible Flu shot - declines COVID vaccine - declines Tdap 2012 Prevnar-20 Shingrix - discussed, declines Ex smoker quit 10 yrs (occ cigarette) Alcohol - 3-4 beers/night  Seat belt use discussed Sunscreen use discussed. No changing moles on skin. Dentist - full dentures 04/2021 Eye exam - has not seen. No fmhx glaucoma   Lives with daughter  Occ: [employer] Activity: no regular exercise  Diet: good water, increasing fruits/vegetables      Relevant past medical, surgical, family and social history reviewed and updated as indicated. Interim medical history since our last visit reviewed. Allergies and medications reviewed and updated. Outpatient Medications Prior to Visit  Medication Sig Dispense Refill   aspirin EC 81 MG tablet Take 1 tablet (81 mg total) by mouth daily. Swallow whole. 90  tablet 3   carvedilol (COREG) 6.25 MG tablet Take 1 tablet (6.25 mg total) by mouth 2 (two) times daily. 60 tablet 2   losartan (COZAAR) 25 MG tablet Take 1 tablet (25 mg total) by mouth daily. 30 tablet 2   Multiple Vitamin (MULTIVITAMIN PO) Take 1 tablet by mouth daily.     Ibuprofen (MOTRIN PO) Take by mouth as needed. As needed for neck pain     pravastatin (PRAVACHOL) 40 MG tablet Take 1 tablet (40 mg total) by mouth every evening. 30 tablet 5   ezetimibe (ZETIA) 10 MG tablet Take 1 tablet (10 mg total) by mouth daily. 90 tablet 3   Facility-Administered Medications Prior to Visit  Medication Dose Route Frequency Provider Last Rate Last Admin   sodium chloride flush (NS) 0.9 % injection 3 mL  3 mL Intravenous Q12H Visser, Jacquelyn D, PA-C         Per HPI unless specifically indicated in ROS section below Review of Systems  Constitutional:  Positive for appetite change (comes and goes). Negative for activity change, chills, fatigue, fever and unexpected weight change.  HENT:  Negative for hearing loss.   Eyes:  Negative for visual disturbance.  Respiratory:  Negative for cough, chest tightness, shortness of breath and wheezing.   Cardiovascular:  Negative for chest pain, palpitations and leg swelling.  Gastrointestinal:  Negative for abdominal distention, abdominal pain, blood in stool, constipation, diarrhea, nausea and vomiting.  Genitourinary:  Negative for difficulty urinating and hematuria.  Musculoskeletal:  Negative for arthralgias, myalgias and neck pain.       Leg cramps - manages with mustard  Skin:  Negative for rash.  Neurological:  Negative for dizziness, seizures, syncope and headaches.  Hematological:  Negative for adenopathy. Bruises/bleeds easily.  Psychiatric/Behavioral:  Negative for dysphoric mood. The patient is not nervous/anxious.     Objective:  BP 134/72   Pulse 63   Ht $R'5\' 11"'ro$  (1.803 m)   Wt 192 lb 9.6 oz (87.4 kg)   SpO2 98%   BMI 26.86 kg/m   Wt  Readings from Last 3 Encounters:  01/14/22 192 lb 9.6 oz (87.4 kg)  09/03/21 190 lb (86.2 kg)  07/30/21 198 lb 12.8 oz (90.2 kg)      Physical Exam Vitals and nursing note reviewed.  Constitutional:      General: He is not in acute distress.    Appearance: Normal appearance. He is well-developed. He is not ill-appearing.  HENT:     Head: Normocephalic and atraumatic.     Right Ear: Hearing, tympanic membrane, ear canal and external ear normal.     Left Ear: Hearing, tympanic membrane, ear canal and external ear normal.  Eyes:     General: No scleral icterus.    Extraocular Movements: Extraocular movements intact.     Conjunctiva/sclera: Conjunctivae normal.     Pupils: Pupils are equal, round, and reactive to light.  Neck:     Thyroid: No thyroid mass or thyromegaly.  Cardiovascular:     Rate and Rhythm: Normal rate and regular rhythm.     Pulses: Normal pulses.          Radial pulses are 2+ on the right side and 2+ on the left side.     Heart sounds: Normal heart sounds. No murmur heard. Pulmonary:     Effort: Pulmonary effort is normal. No respiratory distress.     Breath sounds: Normal breath sounds. No wheezing, rhonchi or rales.  Abdominal:     General: Bowel sounds are normal. There is no distension.     Palpations: Abdomen is soft. There is no mass.     Tenderness: There is no abdominal tenderness. There is no guarding or rebound.     Hernia: A hernia is present. Hernia is present in the right inguinal area (large). There is no hernia in the left inguinal area.  Musculoskeletal:        General: Normal range of motion.     Cervical back: Normal range of motion and neck supple.     Right lower leg: No edema.     Left lower leg: No edema.  Lymphadenopathy:     Cervical: No cervical adenopathy.  Skin:    General: Skin is warm and dry.     Findings: No rash.  Neurological:     General: No focal deficit present.     Mental Status: He is alert and oriented to person,  place, and time.  Psychiatric:        Mood and Affect: Mood normal.        Behavior: Behavior normal.        Thought Content: Thought content normal.        Judgment: Judgment normal.       Results for orders placed or performed during the hospital encounter of 96/78/93  Basic metabolic panel  Result Value Ref Range   Sodium 137 135 - 145 mmol/L   Potassium 4.0 3.5 - 5.1 mmol/L   Chloride 103 98 -  111 mmol/L   CO2 26 22 - 32 mmol/L   Glucose, Bld 99 70 - 99 mg/dL   BUN 13 6 - 20 mg/dL   Creatinine, Ser 0.79 0.61 - 1.24 mg/dL   Calcium 9.1 8.9 - 10.3 mg/dL   GFR, Estimated >60 >60 mL/min   Anion gap 8 5 - 15  CBC  Result Value Ref Range   WBC 9.6 4.0 - 10.5 K/uL   RBC 4.66 4.22 - 5.81 MIL/uL   Hemoglobin 15.4 13.0 - 17.0 g/dL   HCT 44.4 39.0 - 52.0 %   MCV 95.3 80.0 - 100.0 fL   MCH 33.0 26.0 - 34.0 pg   MCHC 34.7 30.0 - 36.0 g/dL   RDW 13.6 11.5 - 15.5 %   Platelets 186 150 - 400 K/uL   nRBC 0.0 0.0 - 0.2 %      01/14/2022    9:42 AM 01/14/2022    9:25 AM 05/22/2020    1:52 PM  Depression screen PHQ 2/9  Decreased Interest 1 0 1  Down, Depressed, Hopeless 0 1 0  PHQ - 2 Score $Remov'1 1 1   'PsZWPd$ Assessment & Plan:   Problem List Items Addressed This Visit     Encounter for general adult medical examination with abnormal findings - Primary (Chronic)    Preventative protocols reviewed and updated unless pt declined. Discussed healthy diet and lifestyle.  Interested in colonoscopy however will start with iFOB given large R inguinal hernia.  Notes some depressed mood due to work stressors. PHQ2 score = 1. Discussed importance of self care.       History of tobacco abuse    Remains abstinent. Not eligible for lung cancer screening program.      Right inguinal hernia    Large. No pain. Desires surgical evaluation this year.  Requests new referral to gen surgery.  Will complete hernia treatment prior to colonoscopy referral.       Relevant Orders   Ambulatory referral to  General Surgery   Hypertension    Chronic, stable. Continue current regimen. Appreciate cards care.      Alcohol use    Encouraged continued cessation efforts.       Hyperlipidemia    Chronic on pravastatin. Update FLP. Appreciate cards care.  The 10-year ASCVD risk score (Arnett DK, et al., 2019) is: 13.7%   Values used to calculate the score:     Age: 73 years     Sex: Male     Is Non-Hispanic African American: No     Diabetic: Yes     Tobacco smoker: No     Systolic Blood Pressure: 858 mmHg     Is BP treated: Yes     HDL Cholesterol: 73 mg/dL     Total Cholesterol: 175 mg/dL       BPH (benign prostatic hyperplasia)    Update PSA      HFrEF (heart failure with reduced ejection fraction) ([place])    Appreciate cards care.       Aortic root dilatation (HCC)   Valvular heart disease   Muscle cramps    ?statin related - trial CoQ10 supplement.       Other Visit Diagnoses     Special screening for malignant neoplasms, colon       Relevant Orders   Fecal occult blood, imunochemical   Need for pneumococcal vaccination       Relevant Orders   Pneumococcal conjugate vaccine 20-valent (Prevnar 20) (Completed)  Meds ordered this encounter  Medications   Coenzyme Q-10 100 MG capsule    Sig: Take 1 capsule (100 mg total) by mouth daily.   Orders Placed This Encounter  Procedures   Fecal occult blood, imunochemical    Standing Status:   Future    Standing Expiration Date:   01/15/2023   Pneumococcal conjugate vaccine 20-valent (Prevnar 20)   Ambulatory referral to General Surgery    Referral Priority:   Routine    Referral Type:   Surgical    Referral Reason:   Specialty Services Required    Requested Specialty:   General Surgery    Number of Visits Requested:   1    Patient instructions: Labs today.  Pass by lab to pick up stool kit.  [id number] today (pneumonia shot).  Work on getting hernia repaired this year.  Try CoEnzyme Q10 supplement 50-$RemoveBeforeDEI'100mg'vOzwDFVDQZJpXGwE$   daily for muscle cramps.  Good to see you today. Return as needed or in 1 year for next physical.   Follow up plan: Return in about 1 year (around 01/15/2023) for annual exam, prior fasting for blood work.  Ria Bush, MD

## 2022-01-19 ENCOUNTER — Telehealth: Payer: Self-pay | Admitting: *Deleted

## 2022-01-19 NOTE — Telephone Encounter (Signed)
Left message on voicemail for patient to call the office back. Need to give patient his lab results when he calls back.

## 2022-01-20 NOTE — Telephone Encounter (Signed)
Lvm asking pt to call back. Need to relay results and get answer to Dr. Synthia Innocent question.   Labs/Dr. Synthia Innocent msg: Your kidneys, liver, urine protein levels and prostate returned normal.  Sugar was mildly elevated for a fasting reading.  Watch added sugars in the diet.  Your cholesterol levels were also mildly elevated.  Are you regularly taking your pravastatin (Pravachol) 40 mg tab prescribed by cardiology?

## 2022-01-21 NOTE — Telephone Encounter (Signed)
Lvm asking pt to call back. Need to relay results and get answer to Dr. Synthia Innocent question.   Labs/Dr. Synthia Innocent msg: Your kidneys, liver, urine protein levels and prostate returned normal.  Sugar was mildly elevated for a fasting reading.  Watch added sugars in the diet.  Your cholesterol levels were also mildly elevated.  Are you regularly taking your pravastatin (Pravachol) 40 mg tab prescribed by cardiology?

## 2022-01-24 NOTE — Telephone Encounter (Signed)
Lvm asking pt to call back. Need to relay results and get answer to Dr. Synthia Innocent question. Mailing a letter.   Labs/Dr. Synthia Innocent msg: Your kidneys, liver, urine protein levels and prostate returned normal.  Sugar was mildly elevated for a fasting reading.  Watch added sugars in the diet.  Your cholesterol levels were also mildly elevated.  Are you regularly taking your pravastatin (Pravachol) 40 mg tab prescribed by cardiology?

## 2022-01-26 NOTE — Telephone Encounter (Signed)
Patient notified as instructed by telephone and verbalized understanding. Patient stated that he is sure that he has enough Pravastatin to last him until his upcoming appointment.

## 2022-01-26 NOTE — Telephone Encounter (Signed)
Patient called back and I let him know of message below, He appologized for not taking the call because he cant take his phone into certain places at work. Patient said he is taking the pravastatin and that there might be a few times he has forgotten but does try to take it. Call back if needed is [phone]. This number is different than number that was on file and he said he didn't realize he hadnt updated it

## 2022-01-26 NOTE — Telephone Encounter (Signed)
Continue pravastatin '40mg'$  daily. Ensure he has enough to last him until appt next month with cardiologist Agbor Etang.

## 2022-02-15 ENCOUNTER — Other Ambulatory Visit: Payer: Self-pay

## 2022-02-15 MED ORDER — LOSARTAN POTASSIUM 25 MG PO TABS: 25.0000 mg | ORAL_TABLET | Freq: Every day | ORAL | 0 refills | Status: DC

## 2022-03-04 ENCOUNTER — Ambulatory Visit (INDEPENDENT_AMBULATORY_CARE_PROVIDER_SITE_OTHER): Payer: BC Managed Care – PPO | Admitting: Cardiology

## 2022-03-04 ENCOUNTER — Encounter: Payer: Self-pay | Admitting: Cardiology

## 2022-03-04 VITALS — BP 150/90 | HR 57 | Ht 71.0 in | Wt 191.5 lb

## 2022-03-04 DIAGNOSIS — I10 Essential (primary) hypertension: Secondary | ICD-10-CM

## 2022-03-04 DIAGNOSIS — E7800 Pure hypercholesterolemia, unspecified: Secondary | ICD-10-CM | POA: Diagnosis not present

## 2022-03-04 DIAGNOSIS — I2510 Atherosclerotic heart disease of native coronary artery without angina pectoris: Secondary | ICD-10-CM | POA: Diagnosis not present

## 2022-03-04 DIAGNOSIS — R9430 Abnormal result of cardiovascular function study, unspecified: Secondary | ICD-10-CM

## 2022-03-04 MED ORDER — LOSARTAN POTASSIUM 25 MG PO TABS: 25.0000 mg | ORAL_TABLET | Freq: Every day | ORAL | 3 refills | Status: DC

## 2022-03-04 NOTE — Patient Instructions (Signed)
Medication Instructions:   Your physician recommends that you continue on your current medications as directed. Please refer to the Current Medication list given to you today.  *If you need a refill on your cardiac medications before your next appointment, please call your pharmacy*    Testing/Procedures:  Your physician has requested that you have an echocardiogram in 6 months. Echocardiography is a painless test that uses sound waves to create images of your heart. It provides your doctor with information about the size and shape of your heart and how well your heart's chambers and valves are working. This procedure takes approximately one hour. There are no restrictions for this procedure.    Follow-Up: At [facility], you and your health needs are our priority.  As part of our continuing mission to provide you with exceptional heart care, we have created designated Provider Care Teams.  These Care Teams include your primary Cardiologist (physician) and Advanced Practice Providers (APPs -  Physician Assistants and Nurse Practitioners) who all work together to provide you with the care you need, when you need it.  We recommend signing up for the patient portal called "MyChart".  Sign up information is provided on this After Visit Summary.  MyChart is used to connect with patients for Virtual Visits (Telemedicine).  Patients are able to view lab/test results, encounter notes, upcoming appointments, etc.  Non-urgent messages can be sent to your provider as well.   To learn more about what you can do with MyChart, go to [email or web address].    Your next appointment:   Follow up after Echo   The format for your next appointment:   In Person  Provider:   You may see Kate Sable, MD or one of the following Advanced Practice Providers on your designated Care Team:   Murray Hodgkins, NP Christell Faith, PA-C Cadence Kathlen Mody, [place]     Important Information About Sugar

## 2022-03-04 NOTE — Progress Notes (Signed)
Cardiology Office Note:    Date:  03/04/2022   ID:  [name], DOB [birth year], MRN [id number]  PCP:  Ria Bush, MD  [facility] Cardiologist:  Kate Sable, MD  [place] Electrophysiologist:  None   Referring MD: Ria Bush, MD   Chief Complaint  Patient presents with   Other    6 month f/u no complaints today. Meds reviewed verbally with pt.    History of Present Illness:    Caleb Spencer is a 61 y.o. male with a hx of CAD (mild prox LAD disease, calcium score 510 on CCTA 02/2021),  hypertension, hyperlipidemia who presents for follow-up.    Being seen for nonobstructive CAD, hyperlipidemia.  Pravachol previously started as patient did not tolerate Crestor.  Tolerating Pravachol, repeat lipid panel showed elevated LDL levels.  Denies chest pain since starting carvedilol.  Denies shortness of breath.  Takes all meds as prescribed.  States feeling a little stressed/anxious this morning, checks blood pressure frequently at home, systolic usually in the 100F.  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   Prior notes Coronary CTA 01/2021 mild LAD disease, calcium score 510, FFR CT no significant stenosis Echo 10/2020 EF 45 to 50% mild to moderate AI Echocardiogram on 02/14/2020 showed normal systolic and diastolic function, EF 12%, mild AI.  No LVH noted on echocardiogram.  Past Medical History:  Diagnosis Date   History of smoking    Kidney stone    Left groin hernia    working on finances to be able to do    Past Surgical History:  Procedure Laterality Date   INGUINAL HERNIA REPAIR  1987   Left    Current Medications: Current Meds  Medication Sig   aspirin EC 81 MG tablet Take 1 tablet (81 mg total) by mouth daily. Swallow whole.   carvedilol (COREG) 6.25 MG tablet Take 1 tablet (6.25 mg total) by mouth 2 (two) times daily.   Multiple Vitamin (MULTIVITAMIN PO) Take 1 tablet by  mouth daily.   pravastatin (PRAVACHOL) 40 MG tablet Take 1 tablet (40 mg total) by mouth every evening.   [DISCONTINUED] losartan (COZAAR) 25 MG tablet Take 1 tablet (25 mg total) by mouth daily.   Current Facility-Administered Medications for the 03/04/22 encounter (Office Visit) with Kate Sable, MD  Medication   sodium chloride flush (NS) 0.9 % injection 3 mL     Allergies:   Imdur [isosorbide nitrate] and Crestor [rosuvastatin]   Social History   Socioeconomic History   Marital status: Single    Spouse name: Not on file   Number of children: Not on file   Years of education: 10th   Highest education level: Not on file  Occupational History   Occupation: fireproofing and Radiographer, therapeutic: [employer]  Tobacco Use   Smoking status: Former    Types: Cigarettes   Smokeless tobacco: Never  Vaping Use   Vaping Use: Never used  Substance and Sexual Activity   Alcohol use: Yes    Alcohol/week: 0.0 standard drinks of alcohol    Comment: 6 pack/weekly-possibly a little more   Drug use: No   Sexual activity: Not on file  Other Topics Concern   Not on file  Social History Narrative   Lives alone, no pets   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 There is no history of Stroke.  ROS:   Please see the history of present illness.     All other systems reviewed and are negative.  EKGs/Labs/Other Studies Reviewed:    The following studies were reviewed today:   EKG:   EKG is  ordered today.  The ekg ordered today demonstrates sinus bradycardia, heart rate 57  Recent Labs: 01/14/2022: ALT 21; BUN 16; Creatinine, Ser 0.92; Potassium 4.2; Sodium 138  Recent Lipid Panel    Component Value Date/Time   CHOL 218 (H) 01/14/2022 0940   CHOL 175 11/20/2020 1231   TRIG 100.0 01/14/2022 0940   HDL 61.80 01/14/2022 0940   HDL 73 11/20/2020 1231   CHOLHDL 4 01/14/2022 0940   VLDL 20.0 01/14/2022 0940   LDLCALC 136 (H) 01/14/2022 0940   LDLCALC 82 11/20/2020 1231   LDLDIRECT 87.0 05/15/2020 0756    Physical Exam:    VS:  BP (!) 150/90 (BP Location: Left Arm, Patient Position: Sitting, Cuff Size: Normal)   Pulse (!) 57   Ht '5\' 11"'$  (1.803 m)   Wt 191 lb 8 oz (86.9 kg)   SpO2 98%   BMI 26.71 kg/m     Wt Readings from Last 3 Encounters:  03/04/22 191 lb 8 oz (86.9 kg)  01/14/22 192 lb 9.6 oz (87.4 kg)  09/03/21 190 lb (86.2 kg)     GEN:  Well nourished, well developed in no acute distress HEENT: Normal NECK: No JVD; No carotid bruits CARDIAC: RRR, no murmurs, rubs, gallops RESPIRATORY:  Clear to auscultation without rales, wheezing or rhonchi  ABDOMEN: Soft, non-tender, non-distended MUSCULOSKELETAL:  No edema; No deformity  SKIN: Warm and dry NEUROLOGIC:  Alert and oriented x 3 PSYCHIATRIC:  Normal affect   ASSESSMENT:    1. Coronary artery disease involving native coronary artery of native heart, unspecified whether angina present   2. Ejection fraction < 50%   3. Primary hypertension   4. Pure hypercholesterolemia     PLAN:    In order of problems listed above:  CAD, nonobstructive, mild calcified proximal LAD disease.  Denies chest pain.  Continue Coreg 6.25 twice daily, aspirin, Pravachol 40 mg daily. Mildly reduced EF of 45%.  Continue Coreg 6.25 mg twice daily, losartan 25 mg daily.  Repeat echo in 6 months Hypertension, BP elevated today, usually controlled.  Continue Coreg and losartan Hyperlipidemia, myalgias with Crestor.   tolerating Pravachol 40 mg daily. continue  Follow-up in 6 months  Total encounter time 40 minutes  Greater than 50% was spent in counseling and coordination of care with the patient   Medication Adjustments/Labs and Tests Ordered: Current medicines are reviewed at length with the patient today.  Concerns regarding medicines are outlined above.  Orders Placed This Encounter  Procedures   EKG 12-Lead   ECHOCARDIOGRAM COMPLETE   Meds ordered this encounter  Medications   losartan (COZAAR) 25 MG tablet    Sig: Take 1 tablet (25 mg total) by mouth daily.    Dispense:  90 tablet    Refill:  3  Patient Instructions  Medication Instructions:   Your physician recommends that you continue on your current medications as directed. Please refer to the Current Medication list given to you today.  *If you need a refill on your cardiac medications before your next appointment, please call your pharmacy*    Testing/Procedures:  Your physician has requested that you have an echocardiogram in 6 months. Echocardiography is a painless test that uses sound waves to create images of your heart. It provides your doctor with information about the size and shape of your heart and how well your heart's chambers and valves are working. This procedure takes approximately one hour. There are no restrictions for this procedure.    Follow-Up: At [facility], you and your health needs are our priority.  As part of our continuing mission to provide you with exceptional heart care, we have created designated Provider Care Teams.  These Care Teams include your primary Cardiologist (physician) and Advanced Practice Providers (APPs -  Physician Assistants and Nurse Practitioners) who all work together to provide you with the care you need, when you need it.  We recommend signing up for the patient portal called "MyChart".  Sign up information is provided on this After Visit Summary.  MyChart is used to  connect with patients for Virtual Visits (Telemedicine).  Patients are able to view lab/test results, encounter notes, upcoming appointments, etc.  Non-urgent messages can be sent to your provider as well.   To learn more about what you can do with MyChart, go to [email or web address].    Your next appointment:   Follow up after Echo   The format for your next appointment:   In Person  Provider:   You may see Kate Sable, MD or one of the following Advanced Practice Providers on your designated Care Team:   Murray Hodgkins, NP Christell Faith, PA-C Cadence Kathlen Mody, [place]     Important Information About Sugar         Signed, Kate Sable, MD  03/04/2022 10:26 AM    [place]

## 2022-03-11 ENCOUNTER — Other Ambulatory Visit: Payer: Self-pay

## 2022-03-11 MED ORDER — CARVEDILOL 6.25 MG PO TABS: 6.2500 mg | ORAL_TABLET | Freq: Two times a day (BID) | ORAL | 5 refills | Status: DC

## 2022-04-01 ENCOUNTER — Ambulatory Visit: Payer: BC Managed Care – PPO | Admitting: Surgery

## 2022-04-22 ENCOUNTER — Telehealth: Payer: Self-pay | Admitting: Cardiology

## 2022-04-22 ENCOUNTER — Encounter: Payer: Self-pay | Admitting: Surgery

## 2022-04-22 ENCOUNTER — Ambulatory Visit (INDEPENDENT_AMBULATORY_CARE_PROVIDER_SITE_OTHER): Payer: BC Managed Care – PPO | Admitting: Surgery

## 2022-04-22 VITALS — BP 165/85 | HR 63 | Temp 98.0°F | Ht 72.0 in | Wt 204.0 lb

## 2022-04-22 DIAGNOSIS — K439 Ventral hernia without obstruction or gangrene: Secondary | ICD-10-CM

## 2022-04-22 DIAGNOSIS — K4090 Unilateral inguinal hernia, without obstruction or gangrene, not specified as recurrent: Secondary | ICD-10-CM

## 2022-04-22 NOTE — Progress Notes (Signed)
Request for Cardiac Clearance has been faxed to Dr Galen Manila at [facility]

## 2022-04-22 NOTE — Telephone Encounter (Signed)
   Pre-operative Risk Assessment    Patient Name: Caleb Spencer  DOB: [birth year] MRN: [id number]      Request for Surgical Clearance    Procedure:  Robotic repair, inguinal and umbilical hernias  Date of Surgery:  Clearance TBD                                 Surgeon:  Dr. Olean Ree Surgeon's Group or Practice Name:  [facility] Phone number:  [phone] Fax number:  [phone]   Type of Clearance Requested:   - Medical    Type of Anesthesia:  General    Additional requests/questions:    Signed, Maxwell Caul   04/22/2022, 1:52 PM

## 2022-04-22 NOTE — Telephone Encounter (Signed)
     Primary Cardiologist: Kate Sable, MD  Chart reviewed as part of pre-operative protocol coverage. Given past medical history and time since last visit, based on ACC/AHA guidelines, Caleb Spencer would be at acceptable risk for the planned procedure without further cardiovascular testing.   His RCRI is a class II risk, 0.9% risk of major cardiac event.  I will route this recommendation to the requesting party via Epic fax function and remove from pre-op pool.  Please call with questions.  [name]. [name] NP-C     04/22/2022, 2:48 PM [place] [address] Office [phone] Fax [phone]

## 2022-04-22 NOTE — Patient Instructions (Addendum)
You have chose to have your hernia repaired. This will be done by Dr. Hampton Abbot at [facility].  We are looking at doing this surgery on 05/12/22.  You will need to stop your Aspirin 5 days prior to surgery.   Please see your (blue) Pre-care information that you have been given today. Our surgery scheduler will call you to verify surgery date and to go over information.   You will need to arrange to be out of work for approximately 1-2 weeks and then you may return with a lifting restriction for 4 more weeks. If you have FMLA or Disability paperwork that needs to be filled out, please have your company fax your paperwork to [phone] or you may drop this by either office. This paperwork will be filled out within 3 days after your surgery has been completed.  You may have a bruise in your groin and also swelling and brusing in your testicle area. You may use ice 4-5 times daily for 15-20 minutes each time. Make sure that you place a barrier between you and the ice pack. To decrease the swelling, you may roll up a bath towel and place it vertically in between your thighs with your testicles resting on the towel. You will want to keep this area elevated as much as possible for several days following surgery.    Inguinal Hernia, Adult Muscles help keep everything in the body in its proper place. But if a weak spot in the muscles develops, something can poke through. That is called a hernia. When this happens in the lower part of the belly (abdomen), it is called an inguinal hernia. (It takes its name from a part of the body in this region called the inguinal canal.) A weak spot in the wall of muscles lets some fat or part of the small intestine bulge through. An inguinal hernia can develop at any age. Men get them more often than women. CAUSES  In adults, an inguinal hernia develops over time. It can be triggered by: Suddenly straining the muscles of the lower abdomen. Lifting heavy objects. Straining to  have a bowel movement. Difficult bowel movements (constipation) can lead to this. Constant coughing. This may be caused by smoking or lung disease. Being overweight. Being pregnant. Working at a job that requires long periods of standing or heavy lifting. Having had an inguinal hernia before. One type can be an emergency situation. It is called a strangulated inguinal hernia. It develops if part of the small intestine slips through the weak spot and cannot get back into the abdomen. The blood supply can be cut off. If that happens, part of the intestine may die. This situation requires emergency surgery. SYMPTOMS  Often, a small inguinal hernia has no symptoms. It is found when a healthcare provider does a physical exam. Larger hernias usually have symptoms.  In adults, symptoms may include: A lump in the groin. This is easier to see when the person is standing. It might disappear when lying down. In men, a lump in the scrotum. Pain or burning in the groin. This occurs especially when lifting, straining or coughing. A dull ache or feeling of pressure in the groin. Signs of a strangulated hernia can include: A bulge in the groin that becomes very painful and tender to the touch. A bulge that turns red or purple. Fever, nausea and vomiting. Inability to have a bowel movement or to pass gas. DIAGNOSIS  To decide if you have an inguinal hernia, a  healthcare provider will probably do a physical examination. This will include asking questions about any symptoms you have noticed. The healthcare provider might feel the groin area and ask you to cough. If an inguinal hernia is felt, the healthcare provider may try to slide it back into the abdomen. Usually no other tests are needed. TREATMENT  Treatments can vary. The size of the hernia makes a difference. Options include: Watchful waiting. This is often suggested if the hernia is small and you have had no symptoms. No medical procedure will be  done unless symptoms develop. You will need to watch closely for symptoms. If any occur, contact your healthcare provider right away. Surgery. This is used if the hernia is larger or you have symptoms. Open surgery. This is usually an outpatient procedure (you will not stay overnight in a hospital). An cut (incision) is made through the skin in the groin. The hernia is put back inside the abdomen. The weak area in the muscles is then repaired by herniorrhaphy or hernioplasty. Herniorrhaphy: in this type of surgery, the weak muscles are sewn back together. Hernioplasty: a patch or mesh is used to close the weak area in the abdominal wall. Laparoscopy. In this procedure, a surgeon makes small incisions. A thin tube with a tiny video camera (called a laparoscope) is put into the abdomen. The surgeon repairs the hernia with mesh by looking with the video camera and using two long instruments. HOME CARE INSTRUCTIONS  After surgery to repair an inguinal hernia: You will need to take pain medicine prescribed by your healthcare provider. Follow all directions carefully. You will need to take care of the wound from the incision. Your activity will be restricted for awhile. This will probably include no heavy lifting for several weeks. You also should not do anything too active for a few weeks. When you can return to work will depend on the type of job that you have. During "watchful waiting" periods, you should: Maintain a healthy weight. Eat a diet high in fiber (fruits, vegetables and whole grains). Drink plenty of fluids to avoid constipation. This means drinking enough water and other liquids to keep your urine clear or pale yellow. Do not lift heavy objects. Do not stand for long periods of time. Quit smoking. This should keep you from developing a frequent cough. SEEK MEDICAL CARE IF:  A bulge develops in your groin area. You feel pain, a burning sensation or pressure in the groin. This might be  worse if you are lifting or straining. You develop a fever of more than 100.5 F (38.1 C). SEEK IMMEDIATE MEDICAL CARE IF:  Pain in the groin increases suddenly. A bulge in the groin gets bigger suddenly and does not go down. For men, there is sudden pain in the scrotum. Or, the size of the scrotum increases. A bulge in the groin area becomes red or purple and is painful to touch. You have nausea or vomiting that does not go away. You feel your heart beating much faster than normal. You cannot have a bowel movement or pass gas. You develop a fever of more than 102.0 F (38.9 C).   This information is not intended to replace advice given to you by your health care provider. Make sure you discuss any questions you have with your health care provider.   Document Released: 11/20/2008 Document Revised: 09/26/2011 Document Reviewed: 01/05/2015 Elsevier Interactive Patient Education [employer].

## 2022-04-23 ENCOUNTER — Encounter: Payer: Self-pay | Admitting: Surgery

## 2022-04-23 NOTE — H&P (View-Only) (Signed)
04/22/2022  Reason for Visit: Right inguinal hernia  Requesting Provider: Ria Bush, MD  History of Present Illness: Caleb Spencer is a 61 y.o. male presenting for evaluation of a right inguinal hernia.  The patient has a history of an open left inguinal hernia repair many years ago, about 20 years.  He reports he has had a right inguinal hernia for now about 10 years.  He feels it has been enlarging in size and causes some more discomfort now, particularly if he's doing anything strenuous.  He's been very hesitant about seeking surgical care for it because of the commercials he's seen about bad mesh and mesh complications.  It is unclear if there was mesh used for his left hernia repair.  Denies any pain in the left groin and denies any pain in the umbilical area.  Past Medical History: Past Medical History:  Diagnosis Date   History of smoking    Kidney stone    Left groin hernia    working on finances to be able to do     Past Surgical History: Past Surgical History:  Procedure Laterality Date   INGUINAL HERNIA REPAIR  1987   Left    Home Medications: Prior to Admission medications   Medication Sig Start Date End Date Taking? Authorizing Provider  aspirin EC 81 MG tablet Take 1 tablet (81 mg total) by mouth daily. Swallow whole. 09/03/21  Yes Agbor-Etang, Aaron Edelman, MD  carvedilol (COREG) 6.25 MG tablet Take 1 tablet (6.25 mg total) by mouth 2 (two) times daily. 03/11/22  Yes Agbor-Etang, Aaron Edelman, MD  losartan (COZAAR) 25 MG tablet Take 1 tablet (25 mg total) by mouth daily. 03/04/22 06/03/23 Yes Agbor-Etang, Aaron Edelman, MD  Multiple Vitamin (MULTIVITAMIN PO) Take 1 tablet by mouth daily.   Yes [provider]  pravastatin (PRAVACHOL) 40 MG tablet Take 1 tablet (40 mg total) by mouth every evening. 09/03/21 03/04/22  Kate Sable, MD    Allergies: Allergies  Allergen Reactions   Imdur [Isosorbide Nitrate] Other (See Comments)    Gets a headache   Crestor  [Rosuvastatin] Other (See Comments)    Myalgias    Social History:  reports that he has quit smoking. His smoking use included cigarettes. He has never used smokeless tobacco. He reports current alcohol use. He reports that he does not use drugs.   Family History: Family History  Problem Relation Age of Onset   Diabetes Mother    Alcohol abuse Father    Coronary artery disease Father    Lung cancer Maternal Aunt    Stroke Neg Hx     Review of Systems: Review of Systems  Constitutional:  Negative for chills and fever.  HENT:  Negative for hearing loss.   Respiratory:  Negative for shortness of breath.   Cardiovascular:  Negative for chest pain.  Gastrointestinal:  Positive for abdominal pain. Negative for diarrhea, nausea and vomiting.  Genitourinary:  Negative for dysuria.  Musculoskeletal:  Negative for myalgias.  Skin:  Negative for rash.  Neurological:  Negative for dizziness.  Psychiatric/Behavioral:  Negative for depression.     Physical Exam BP (!) 165/85   Pulse 63   Temp 98 F (36.7 C)   Ht 6' (1.829 m)   Wt 204 lb (92.5 kg)   SpO2 97%   BMI 27.67 kg/m  CONSTITUTIONAL: No acute distress, well-nourished HEENT:  Normocephalic, atraumatic, extraocular motion intact. NECK: Trachea is midline, and there is no jugular venous distension.  RESPIRATORY:  Lungs are clear,  and breath sounds are equal bilaterally. Normal respiratory effort without pathologic use of accessory muscles. CARDIOVASCULAR: Heart is regular without murmurs, gallops, or rubs. GI: The abdomen is soft, nondistended, with some discomfort to palpation in the right groin.  The patient has an incarcerated/partially reducible right inguinal hernia.  Most likely this bowel that comes out through the defect.  No palpable hernia defect on the left side.  The patient does have however either 2 or 3 very adjacent hernia defects at the umbilical area and supraumbilical area.  This is reducible but also with some  discomfort to palpation. MUSCULOSKELETAL:  Normal muscle strength and tone in all four extremities.  No peripheral edema or cyanosis. SKIN: Skin turgor is normal. There are no pathologic skin lesions.  NEUROLOGIC:  Motor and sensation is grossly normal.  Cranial nerves are grossly intact. PSYCH:  Alert and oriented to person, place and time. Affect is normal.  Laboratory Analysis: Labs from 01/14/2022: Sodium 138, potassium 4.2, chloride 103, CO2 30, BUN 16, creatinine 0.92.  LFTs within normal.  Imaging: No results found.  Assessment and Plan: This is a 61 y.o. male with a right inguinal hernia and an umbilical/ventral hernia.  - Discussed with patient the findings on his physical exam showing a right inguinal hernia that is partially incarcerated as well as potentially 2 versus 3 adjacent umbilical/ventral hernias.  Discussed with the patient that given the symptoms that are now progressing, it would be prudent to proceed with surgery.  Discussed with him the newer methods of proceeding with hernia repair as well as the newer mesh that can be used.  Discussed with him that unfortunately is always a risk of complications with mesh and other risk for surgery, but will take all the appropriate measures possible in order to prevent these risks. -Discussed with patient the plan for a robotic assisted right inguinal hernia repair, possible open inguinal hernia repair, and open umbilical/ventral hernia repair.  Reviewed with him the surgery plan at length including the incisions to be used, the risks of bleeding, infection, injury to surrounding structures, the use of mesh, potential for chronic pain, postoperative activity restrictions, that this would be an outpatient procedure, pain control, and he is willing to proceed.  Discussed with him that given the longstanding nature of his right inguinal hernia, if the scar tissue is too thick or it is too difficult to safely reduce all the hernia contents,  then we will have to proceed with open hernia repair.  She understands this. - We will schedule him for surgery on 05/12/2022.  Last dose of aspirin will be on 05/06/2022.  We will also obtain cardiology clearance.  I spent 55 minutes dedicated to the care of this patient on the date of this encounter to include pre-visit review of records, face-to-face time with the patient discussing diagnosis and management, and any post-visit coordination of care.   Melvyn Neth, [facility]

## 2022-04-23 NOTE — Progress Notes (Unsigned)
04/22/2022  Reason for Visit: Right inguinal hernia  Requesting Provider: Ria Bush, MD  History of Present Illness: Caleb Spencer is a 61 y.o. male presenting for evaluation of a right inguinal hernia.  The patient has a history of an open left inguinal hernia repair many years ago, about 20 years.  He reports he has had a right inguinal hernia for now about 10 years.  He feels it has been enlarging in size and causes some more discomfort now, particularly if he's doing anything strenuous.  He's been very hesitant about seeking surgical care for it because of the commercials he's seen about bad mesh and mesh complications.  It is unclear if there was mesh used for his left hernia repair.  Denies any pain in the left groin and denies any pain in the umbilical area.  Past Medical History: Past Medical History:  Diagnosis Date   History of smoking    Kidney stone    Left groin hernia    working on finances to be able to do     Past Surgical History: Past Surgical History:  Procedure Laterality Date   INGUINAL HERNIA REPAIR  1987   Left    Home Medications: Prior to Admission medications   Medication Sig Start Date End Date Taking? Authorizing Provider  aspirin EC 81 MG tablet Take 1 tablet (81 mg total) by mouth daily. Swallow whole. 09/03/21  Yes Agbor-Etang, Aaron Edelman, MD  carvedilol (COREG) 6.25 MG tablet Take 1 tablet (6.25 mg total) by mouth 2 (two) times daily. 03/11/22  Yes Agbor-Etang, Aaron Edelman, MD  losartan (COZAAR) 25 MG tablet Take 1 tablet (25 mg total) by mouth daily. 03/04/22 06/03/23 Yes Agbor-Etang, Aaron Edelman, MD  Multiple Vitamin (MULTIVITAMIN PO) Take 1 tablet by mouth daily.   Yes [provider]  pravastatin (PRAVACHOL) 40 MG tablet Take 1 tablet (40 mg total) by mouth every evening. 09/03/21 03/04/22  Kate Sable, MD    Allergies: Allergies  Allergen Reactions   Imdur [Isosorbide Nitrate] Other (See Comments)    Gets a headache   Crestor  [Rosuvastatin] Other (See Comments)    Myalgias    Social History:  reports that he has quit smoking. His smoking use included cigarettes. He has never used smokeless tobacco. He reports current alcohol use. He reports that he does not use drugs.   Family History: Family History  Problem Relation Age of Onset   Diabetes Mother    Alcohol abuse Father    Coronary artery disease Father    Lung cancer Maternal Aunt    Stroke Neg Hx     Review of Systems: Review of Systems  Constitutional:  Negative for chills and fever.  HENT:  Negative for hearing loss.   Respiratory:  Negative for shortness of breath.   Cardiovascular:  Negative for chest pain.  Gastrointestinal:  Positive for abdominal pain. Negative for diarrhea, nausea and vomiting.  Genitourinary:  Negative for dysuria.  Musculoskeletal:  Negative for myalgias.  Skin:  Negative for rash.  Neurological:  Negative for dizziness.  Psychiatric/Behavioral:  Negative for depression.     Physical Exam BP (!) 165/85   Pulse 63   Temp 98 F (36.7 C)   Ht 6' (1.829 m)   Wt 204 lb (92.5 kg)   SpO2 97%   BMI 27.67 kg/m  CONSTITUTIONAL: No acute distress, well-nourished HEENT:  Normocephalic, atraumatic, extraocular motion intact. NECK: Trachea is midline, and there is no jugular venous distension.  RESPIRATORY:  Lungs are clear,  and breath sounds are equal bilaterally. Normal respiratory effort without pathologic use of accessory muscles. CARDIOVASCULAR: Heart is regular without murmurs, gallops, or rubs. GI: The abdomen is soft, nondistended, with some discomfort to palpation in the right groin.  The patient has an incarcerated/partially reducible right inguinal hernia.  Most likely this bowel that comes out through the defect.  No palpable hernia defect on the left side.  The patient does have however either 2 or 3 very adjacent hernia defects at the umbilical area and supraumbilical area.  This is reducible but also with some  discomfort to palpation. MUSCULOSKELETAL:  Normal muscle strength and tone in all four extremities.  No peripheral edema or cyanosis. SKIN: Skin turgor is normal. There are no pathologic skin lesions.  NEUROLOGIC:  Motor and sensation is grossly normal.  Cranial nerves are grossly intact. PSYCH:  Alert and oriented to person, place and time. Affect is normal.  Laboratory Analysis: Labs from 01/14/2022: Sodium 138, potassium 4.2, chloride 103, CO2 30, BUN 16, creatinine 0.92.  LFTs within normal.  Imaging: No results found.  Assessment and Plan: This is a 61 y.o. male with a right inguinal hernia and an umbilical/ventral hernia.  - Discussed with patient the findings on his physical exam showing a right inguinal hernia that is partially incarcerated as well as potentially 2 versus 3 adjacent umbilical/ventral hernias.  Discussed with the patient that given the symptoms that are now progressing, it would be prudent to proceed with surgery.  Discussed with him the newer methods of proceeding with hernia repair as well as the newer mesh that can be used.  Discussed with him that unfortunately is always a risk of complications with mesh and other risk for surgery, but will take all the appropriate measures possible in order to prevent these risks. -Discussed with patient the plan for a robotic assisted right inguinal hernia repair, possible open inguinal hernia repair, and open umbilical/ventral hernia repair.  Reviewed with him the surgery plan at length including the incisions to be used, the risks of bleeding, infection, injury to surrounding structures, the use of mesh, potential for chronic pain, postoperative activity restrictions, that this would be an outpatient procedure, pain control, and he is willing to proceed.  Discussed with him that given the longstanding nature of his right inguinal hernia, if the scar tissue is too thick or it is too difficult to safely reduce all the hernia contents,  then we will have to proceed with open hernia repair.  She understands this. - We will schedule him for surgery on 05/12/2022.  Last dose of aspirin will be on 05/06/2022.  We will also obtain cardiology clearance.  I spent 55 minutes dedicated to the care of this patient on the date of this encounter to include pre-visit review of records, face-to-face time with the patient discussing diagnosis and management, and any post-visit coordination of care.   Melvyn Neth, [facility]

## 2022-04-25 ENCOUNTER — Telehealth: Payer: Self-pay | Admitting: Surgery

## 2022-04-25 NOTE — Telephone Encounter (Signed)
Outgoing call is made, left message for patient to call.  Please inform him of the following regarding scheduled surgery.   Pre-Admission date/time, and Surgery date at [facility].  Surgery Date: 05/12/22 Preadmission Testing Date: 05/04/22 (phone 8a-1p)  Also patient will need to call at [phone], between 1-3:00pm the day before surgery, to find out what time to arrive for surgery.

## 2022-04-25 NOTE — Progress Notes (Signed)
Cardiac Clearance has been received from Coletta Memos, NP at [facility]. The patient is at acceptable risk for the planned procedure. All notes are in Epic.

## 2022-04-25 NOTE — Telephone Encounter (Signed)
Patient calls back, he is now informed of all dates regarding his surgery and verbalized understanding.

## 2022-05-04 ENCOUNTER — Encounter
Admission: RE | Admit: 2022-05-04 | Discharge: 2022-05-04 | Disposition: A | Discharge status: Home / Self Care | Payer: BC Managed Care – PPO | Source: Ambulatory Visit | Attending: Surgery | Admitting: Surgery

## 2022-05-04 HISTORY — DX: Essential (primary) hypertension: I10

## 2022-05-04 HISTORY — DX: Atherosclerotic heart disease of native coronary artery without angina pectoris: I25.10

## 2022-05-04 HISTORY — DX: Respiratory tuberculosis unspecified: A15.9

## 2022-05-04 HISTORY — DX: Personal history of urinary calculi: Z87.442

## 2022-05-04 HISTORY — DX: Gastro-esophageal reflux disease without esophagitis: K21.9

## 2022-05-04 HISTORY — DX: Benign prostatic hyperplasia without lower urinary tract symptoms: N40.0

## 2022-05-04 HISTORY — DX: Other complications of anesthesia, initial encounter: T88.59XA

## 2022-05-04 HISTORY — DX: Hyperlipidemia, unspecified: E78.5

## 2022-05-04 NOTE — Patient Instructions (Addendum)
Your procedure is scheduled on: May 12, 2022 THURSDAY  Report to the Registration Desk on the 1st floor of the [facility]. To find out your arrival time, please call [phone] between [place] on: May 11, 2022 [facility] If your arrival time is 6:00 am, do not arrive prior to that time as the [facility] entrance doors do not open until 6:00 am.  REMEMBER: Instructions that are not followed completely may result in serious medical risk, up to and including death; or upon the discretion of your surgeon and anesthesiologist your surgery may need to be rescheduled.  Do not eat food after midnight the night before surgery.  No gum chewing, lozengers or hard candies.  You may however, drink CLEAR liquids up to 2 hours before you are scheduled to arrive for your surgery. Do not drink anything within 2 hours of your scheduled arrival time.  Clear liquids include: - water  - apple juice without pulp - gatorade (not RED colors) - black coffee or tea (Do NOT add milk or creamers to the coffee or tea) Do NOT drink anything that is not on this list.  TAKE THESE MEDICATIONS THE MORNING OF SURGERY WITH A SIP OF WATER: -carvedilol (COREG)  LAST DOSE OF ASPIRIN WAS LAST WEEK ON(04-25-22)  One week prior to surgery: Stop Anti-inflammatories (NSAIDS) such as Advil, Aleve, Ibuprofen, Motrin, Naproxen, Naprosyn and Aspirin based products such as Excedrin, Goodys Powder, BC Powder.You may however, continue to take Tylenol if needed for pain up until the day of surgery.  Stop ANY OVER THE COUNTER supplements until after surgery (Multivitamin)  No Alcohol for 24 hours before or after surgery.  No Smoking including e-cigarettes for 24 hours prior to surgery.  No chewable tobacco products for at least 6 hours prior to surgery.  No nicotine patches on the day of surgery.  Do not use any "recreational" drugs for at least a week prior to your surgery.  Please be advised that the combination  of cocaine and anesthesia may have negative outcomes, up to and including death. If you test positive for cocaine, your surgery will be cancelled.  On the morning of surgery brush your teeth with toothpaste and water, you may rinse your mouth with mouthwash if you wish. Do not swallow any toothpaste or mouthwash.  Use CHG Soap as directed on instruction sheet.  Do not wear jewelry, make-up, hairpins, clips or nail polish.  Do not wear lotions, powders, perfumes, cologne or deodorant  Do not shave body from the neck down 48 hours prior to surgery just in case you cut yourself which could leave a site for infection.  Also, freshly shaved skin may become irritated if using the CHG soap.  Contact lenses, hearing aids and dentures may not be worn into surgery.  Do not bring valuables to the hospital. [facility] is not responsible for any missing/lost belongings or valuables.   Notify your doctor if there is any change in your medical condition (cold, fever, infection).  Wear comfortable clothing (specific to your surgery type) to the hospital.  After surgery, you can help prevent lung complications by doing breathing exercises.  Take deep breaths and cough every 1-2 hours. Your doctor may order a device called an Incentive Spirometer to help you take deep breaths. When coughing or sneezing, hold a pillow firmly against your incision with both hands. This is called "splinting." Doing this helps protect your incision. It also decreases belly discomfort.   If you are  being discharged the day of surgery, you will not be allowed to drive home. You will need a responsible adult (18 years or older) to drive you home and stay with you that night.   If you are taking public transportation, you will need to have a responsible adult (18 years or older) with you. Please confirm with your physician that it is acceptable to use public transportation.   Please call the [place] Dept. at  [phone] if you have any questions about these instructions.  Marland Kitchen

## 2022-05-04 NOTE — Pre-Procedure Instructions (Signed)
Multiple attempts to reach patient for his scheduled appointment with PAT, VM left on cell phone number provided and I spoke with his sister, Jennette Banker as we have permission to discuss medical information with her but she declines. She will attempt to reach him and have him call us.

## 2022-05-09 ENCOUNTER — Encounter: Payer: Self-pay | Admitting: Surgery

## 2022-05-09 NOTE — Progress Notes (Signed)
Perioperative Services  Pre-Admission/Anesthesia Testing Clinical Review  Date: 05/09/22  Patient Demographics:  Name: Caleb Spencer DOB:   08/02/1957 MRN:   [id number]  Planned Surgical Procedure(s):    Case: [id number] Date/Time: 05/12/22 0929   Procedures:      XI ROBOTIC ASSISTED INGUINAL HERNIA (Right)     HERNIA REPAIR UMBILICAL ADULT, open     HERNIA REPAIR VENTRAL ADULT, open   Anesthesia type: General   Pre-op diagnosis: right inguinal hernia, ventral  hernia 3-7 cm   Location: [facility] OR ROOM 04 / [facility] FOR ANESTHESIA [facility]   Surgeons: Olean Ree, MD   NOTE: Available PAT nursing documentation and vital signs have been reviewed. Clinical nursing staff has updated patient's PMH/PSHx, current medication list, and drug allergies/intolerances to ensure comprehensive history available to assist in medical decision making as it pertains to the aforementioned surgical procedure and anticipated anesthetic course. Extensive review of available clinical information performed. [place] PMH and PSHx updated with any diagnoses/procedures that  may have been inadvertently omitted during his intake with the pre-admission testing department's nursing staff.  Clinical Discussion:  Caleb Spencer is a 61 y.o. male who is submitted for pre-surgical anesthesia review and clearance prior to him undergoing the above procedure. Patient is a Former Smoker (quit 07/2019). Pertinent PMH includes: CAD, HFrEF, HTN, HLD, GERD (no daily Tx), BPH, inguinal hernia, occasional marijuana use.  Patient is followed by cardiology Garen Lah, MD). He was last seen in the cardiology clinic on 03/04/2022; notes reviewed.  At the time of his clinic visit, patient doing well overall from a cardiovascular perspective.  He denies any episodes of chest pain, short of breath, PND, orthopnea, palpitations, significant peripheral edema, vertiginous symptoms, or presyncope/syncope.  Patient reporting increase work-related  stress.  He has a past medical history significant for cardiovascular diagnoses.  Most recent TTE was performed on 10/23/2020 revealing a mildly reduced left ventricular systolic function with an EF of 45-50%.  There was global hypokinesis of left ventricle.  Left ventricular diastolic Doppler parameters consistent with pseudonormalization (G2DD).  GLS -11.1%.  There was mild mitral valve and mild to moderate aortic valve regurgitation noted.  Aortic valve sclerotic/calcified with no evidence of valvular stenosis.  Coronary CTA performed on 01/22/2021 revealed a elevated coronary calcium score of 5 cm, which was the 92nd percentile for age and sex matched control.  There was minimal (1-24%) stenosis noted within the RCA, minimal to mild (25-49% stenosis of the LAD, and minimal (1-24%) proximal LCx disease.  FFR analysis was completed as follows (normal FFR range is >0.80):   Left Main = 0.99 LAD: Proximal FFR = 0.96, Mid FFR =0.93, Distal FFR = 0.88 LCX: Proximal FFR = 0.97, Distal FFR = 0.95 Ramus intermedius: Proximal FFR = 0.98, Distal FFR = 0.85 RCA: Proximal FFR = 0.99, Mid FFR = 0.92, Distal FFR = 0.92  Blood pressure elevated at 150/90 mmHg on prescribed beta-blocker (carvedilol) and ARB (losartan) therapies.  Patient is on pravastatin for his HLD.  He is not diabetic. Patient does not have an OSAH diagnosis. Functional capacity, as defined by DASI, is documented as being >/= 4 METS.  No changes were made to his medication regimen.  Patient follow-up with outpatient cardiology in 6 months or sooner if needed.  Caleb Spencer is scheduled for elective XI ROBOTIC ASSISTED RIGHT INGUINAL HERNIA; UMBILICAL HERNIA REPAIR; VENTRAL HERNIA REPAIR on 05/12/2022 with Dr. Ardath Sax, MD.  Given patient's past medical history significant for cardiovascular diagnoses,  presurgical cardiac clearance was sought by the PAT team. Per cardiology, "based ACC/AHA guidelines, the patient's past medical history, and  the amount of time since his last clinic visit, this patient would be at an overall ACCEPTABLE risk for the planned procedure without further cardiovascular testing or intervention at this time". In review of his medication reconciliation, it is noted that patient is currently on prescribed daily antiplatelet therapy. He has been instructed on recommendations for holding his daily low-dose ASA for 7 days prior to his procedure with plans to restart since postoperative bleeding risk felt to be minimized by his primary attending surgeon.  During his PAT interview, patient advised that he just stopped taking aspirin on his own with his last dose being on 04/25/2022.   Patient denies previous perioperative complications with anesthesia in the past.  In review his EMR, there are no records available for review pertaining to past procedural/anesthetic courses within the [facility] system.     04/22/2022   10:45 AM 03/04/2022    9:46 AM 01/14/2022    9:00 AM  Vitals with BMI  Height '6\' 0"'$  '5\' 11"'$  '5\' 11"'$   Weight 204 lbs 191 lbs 8 oz 192 lbs 10 oz  BMI 27.66 21.19 41.74  Systolic 081 448 185  Diastolic 85 90 72  Pulse 63 57 63    Providers/Specialists:   NOTE: Primary physician provider listed below. Patient may have been seen by APP or partner within same practice.   PROVIDER ROLE / SPECIALTY LAST Delight Stare, MD General Surgery (Surgeon) 04/22/2022  Ria Bush, MD Primary Care Provider 01/14/2022  Kate Sable, MD Cardiology 03/04/2022   Allergies:  Imdur [isosorbide nitrate] and Crestor [rosuvastatin]  Current Home Medications:    sodium chloride flush (NS) 0.9 % injection 3 mL    aspirin EC 81 MG tablet   carvedilol (COREG) 6.25 MG tablet   losartan (COZAAR) 25 MG tablet   Multiple Vitamin (MULTIVITAMIN PO)   pravastatin (PRAVACHOL) 40 MG tablet   History:   Past Medical History:  Diagnosis Date   BPH (benign prostatic hyperplasia)    Complication of anesthesia     a.) delayed emergence   Coronary artery disease    a.) cCTA 01/22/2021: Ca score 510 (92nd percentile age/sex match control) --> 1-24% p-mRCA, 25-49% pLAD, 1-24% pLCx --> FFR analysis normal   GERD (gastroesophageal reflux disease)    HFrEF (heart failure with reduced ejection fraction) ([place])    a.) TTE 10/23/2020: EF 45-50%, glob HK, mild MR, mild-mod AR, mild AoV sclerosis with no stenosis, G2DD, GLS -11.1%   History of kidney stones    History of smoking    Hyperlipidemia    Hypertension    Left groin hernia    Occasional use of marijuana    Tuberculosis    Past Surgical History:  Procedure Laterality Date   INGUINAL HERNIA REPAIR Left 07/18/1985   Family History  Problem Relation Age of Onset   Diabetes Mother    Alcohol abuse Father    Coronary artery disease Father    Lung cancer Maternal Aunt    Stroke Neg Hx    Social History   Tobacco Use   Smoking status: Former    Packs/day: 0.25    Types: Cigarettes    Quit date: 2021    Years since quitting: 2.8   Smokeless tobacco: Never  Vaping Use   Vaping Use: Never used  Substance Use Topics   Alcohol use: Yes  Comment: 2 beers nightly   Drug use: Yes    Types: Marijuana    Comment: occ    Pertinent Clinical Results:  LABS: Labs reviewed: Acceptable for surgery.  Hospital Outpatient Visit on 05/11/2022  Component Date Value Ref Range Status   WBC 05/11/2022 6.3  4.0 - 10.5 K/uL Final   RBC 05/11/2022 4.62  4.22 - 5.81 MIL/uL Final   Hemoglobin 05/11/2022 15.0  13.0 - 17.0 g/dL Final   HCT 05/11/2022 44.2  39.0 - 52.0 % Final   MCV 05/11/2022 95.7  80.0 - 100.0 fL Final   MCH 05/11/2022 32.5  26.0 - 34.0 pg Final   MCHC 05/11/2022 33.9  30.0 - 36.0 g/dL Final   RDW 05/11/2022 12.4  11.5 - 15.5 % Final   Platelets 05/11/2022 204  150 - 400 K/uL Final   nRBC 05/11/2022 0.0  0.0 - 0.2 % Final   Performed at [facility], [address] [place], [place] [address]   Component Date Value Ref  Range Status   Microalb, Ur 01/14/2022 1.8  0.0 - 1.9 mg/dL Final   Creatinine,U 01/14/2022 156.3  mg/dL Final   Microalb Creat Ratio 01/14/2022 1.1  0.0 - 30.0 mg/g Final   PSA 01/14/2022 0.84  0.10 - 4.00 ng/mL Final   Test performed using Access Hybritech PSA Assay, a parmagnetic partical, chemiluminecent immunoassay.   Sodium 01/14/2022 138  135 - 145 mEq/L Final   Potassium 01/14/2022 4.2  3.5 - 5.1 mEq/L Final   Chloride 01/14/2022 103  96 - 112 mEq/L Final   CO2 01/14/2022 30  19 - 32 mEq/L Final   Glucose, Bld 01/14/2022 105 (H)  70 - 99 mg/dL Final   BUN 01/14/2022 16  6 - 23 mg/dL Final   Creatinine, Ser 01/14/2022 0.92  0.40 - 1.50 mg/dL Final   Total Bilirubin 01/14/2022 0.4  0.2 - 1.2 mg/dL Final   Alkaline Phosphatase 01/14/2022 40  39 - 117 U/L Final   AST 01/14/2022 27  0 - 37 U/L Final   ALT 01/14/2022 21  0 - 53 U/L Final   Total Protein 01/14/2022 7.2  6.0 - 8.3 g/dL Final   Albumin 01/14/2022 4.3  3.5 - 5.2 g/dL Final   GFR 01/14/2022 90.50  >60.00 mL/min Final   Calculated using the CKD-EPI Creatinine Equation (2021)   Calcium 01/14/2022 9.3  8.4 - 10.5 mg/dL Final   Cholesterol 01/14/2022 218 (H)  0 - 200 mg/dL Final   ATP III Classification       Desirable:  < 200 mg/dL               Borderline High:  200 - 239 mg/dL          High:  > = 240 mg/dL   Triglycerides 01/14/2022 100.0  0.0 - 149.0 mg/dL Final   Normal:  <150 mg/dLBorderline High:  150 - 199 mg/dL   HDL 01/14/2022 61.80  >39.00 mg/dL Final   VLDL 01/14/2022 20.0  0.0 - 40.0 mg/dL Final   LDL Cholesterol 01/14/2022 136 (H)  0 - 99 mg/dL Final   Total CHOL/HDL Ratio 01/14/2022 4   Final                  Men          Women1/2 Average Risk     3.4          3.3Average Risk          5.0  4.42X Average Risk          9.6          7.13X Average Risk          15.0          11.0                       NonHDL 01/14/2022 156.06   Final   NOTE:  Non-HDL goal should be 30 mg/dL higher than patient's LDL goal  (i.e. LDL goal of < 70 mg/dL, would have non-HDL goal of < 100 mg/dL)    ECG: Date: 03/04/2022 Time ECG obtained: 0948 AM Rate: 57 bpm Rhythm: sinus bradycardia Axis (leads I and aVF): Normal Intervals: PR 132 ms. QRS 98 ms. QTc 393 ms. ST segment and T wave changes:  Comparison: Similar to previous tracing obtained on 09/03/2021   IMAGING / PROCEDURES: CT CORONARY MORPH W/CTA COR W/SCORE W/CA W/CM &/OR WO/CM AND FRACTIONAL FLOW RESERVE FLUID ANALYSIS performed on 01/22/2022 Minimal to mild mixed non-obstructive CAD, CADRADS = 2. Will send FFR given heavy proximal LAD stenosis, however, it does not appear obstructive. Blooming artifact is suspected. Coronary calcium score of 510. This was 92nd percentile for age and sex matched control. Normal coronary origin with right dominance. No aortic root dilatation or thoracic aortic aneurysm noted. Aggressive cardiovascular risk factor modification is recommended. FFR analysis (normal FFR range is >0.80): Left Main = 0.99 LAD: Proximal = 0.96, Mid = 0.93, Distal = 0.88 LCX: Proximal = 0.97, Distal = 0.95 Ramus intermedius: Proximal = 0.98, Distal = 0.85 RCA: Proximal = 0.99, Mid = 0.92, Distal = 0.92   TRANSTHORACIC ECHOCARDIOGRAM performed on 10/23/2020 Mildly reduced left ventricular systolic function with an EF of 45-50% Global left ventricular hypokinesis Left ventricular internal cavity size moderately dilated Left ventricular diastolic Doppler parameters consistent with pseudonormalization (G2DD). GLS -11.1% Mild mitral valve regurgitation Mild aortic valve sclerosis with no evidence of stenosis; mild to moderate regurgitation Normal transvalvular gradients; no valvular stenosis No pericardial effusion   Impression and Plan:  Caleb Spencer has been referred for pre-anesthesia review and clearance prior to him undergoing the planned anesthetic and procedural courses. Available labs, pertinent testing, and imaging results were  personally reviewed by me. This patient has been appropriately cleared by cardiology with an overall ACCEPTABLE risk of significant perioperative cardiovascular complications.  Based on clinical review performed today (05/09/22), barring any significant acute changes in the patient's overall condition, it is anticipated that he will be able to proceed with the planned surgical intervention. Any acute changes in clinical condition may necessitate his procedure being postponed and/or cancelled. Patient will meet with anesthesia team (MD and/or CRNA) on the day of his procedure for preoperative evaluation/assessment. Questions regarding anesthetic course will be fielded at that time.   Pre-surgical instructions were reviewed with the patient during his PAT appointment and questions were fielded by PAT clinical staff. Patient was advised that if any questions or concerns arise prior to his procedure then he should return a call to PAT and/or his surgeon's office to discuss.  Honor Loh, MSN, APRN, FNP-C, CEN [facility]  Peri-operative Services Nurse Practitioner Phone: [phone] Fax: [phone] 05/09/22 10:40 AM  NOTE: This note has been prepared using Dragon dictation software. Despite my best ability to proofread, there is always the potential that unintentional transcriptional errors may still occur from this process.

## 2022-05-11 ENCOUNTER — Encounter
Admission: RE | Admit: 2022-05-11 | Discharge: 2022-05-11 | Disposition: A | Discharge status: Home / Self Care | Payer: BC Managed Care – PPO | Source: Ambulatory Visit | Attending: Surgery | Admitting: Surgery

## 2022-05-11 DIAGNOSIS — K439 Ventral hernia without obstruction or gangrene: Secondary | ICD-10-CM | POA: Diagnosis not present

## 2022-05-11 DIAGNOSIS — K4090 Unilateral inguinal hernia, without obstruction or gangrene, not specified as recurrent: Secondary | ICD-10-CM | POA: Insufficient documentation

## 2022-05-11 DIAGNOSIS — K429 Umbilical hernia without obstruction or gangrene: Secondary | ICD-10-CM | POA: Diagnosis not present

## 2022-05-11 DIAGNOSIS — I10 Essential (primary) hypertension: Secondary | ICD-10-CM | POA: Insufficient documentation

## 2022-05-11 DIAGNOSIS — Z01812 Encounter for preprocedural laboratory examination: Secondary | ICD-10-CM | POA: Insufficient documentation

## 2022-05-11 DIAGNOSIS — I110 Hypertensive heart disease with heart failure: Secondary | ICD-10-CM | POA: Diagnosis not present

## 2022-05-11 DIAGNOSIS — Z87891 Personal history of nicotine dependence: Secondary | ICD-10-CM | POA: Diagnosis not present

## 2022-05-11 DIAGNOSIS — E785 Hyperlipidemia, unspecified: Secondary | ICD-10-CM | POA: Diagnosis not present

## 2022-05-11 DIAGNOSIS — I2510 Atherosclerotic heart disease of native coronary artery without angina pectoris: Secondary | ICD-10-CM | POA: Diagnosis not present

## 2022-05-11 DIAGNOSIS — I5022 Chronic systolic (congestive) heart failure: Secondary | ICD-10-CM | POA: Diagnosis not present

## 2022-05-11 DIAGNOSIS — N400 Enlarged prostate without lower urinary tract symptoms: Secondary | ICD-10-CM | POA: Diagnosis not present

## 2022-05-11 DIAGNOSIS — I5020 Unspecified systolic (congestive) heart failure: Secondary | ICD-10-CM | POA: Insufficient documentation

## 2022-05-11 LAB — CBC
HCT: 44.2 % (ref 39.0–52.0)
Hemoglobin: 15 g/dL (ref 13.0–17.0)
MCH: 32.5 pg (ref 26.0–34.0)
MCHC: 33.9 g/dL (ref 30.0–36.0)
MCV: 95.7 fL (ref 80.0–100.0)
Platelets: 204 10*3/uL (ref 150–400)
RBC: 4.62 MIL/uL (ref 4.22–5.81)
RDW: 12.4 % (ref 11.5–15.5)
WBC: 6.3 10*3/uL (ref 4.0–10.5)
nRBC: 0 % (ref 0.0–0.2)

## 2022-05-12 ENCOUNTER — Encounter
Admission: RE | Disposition: A | Discharge status: Home / Self Care | Payer: Self-pay | Source: Home / Self Care | Attending: Surgery

## 2022-05-12 ENCOUNTER — Ambulatory Visit: Payer: BC Managed Care – PPO | Admitting: Urgent Care

## 2022-05-12 ENCOUNTER — Encounter: Payer: Self-pay | Admitting: Surgery

## 2022-05-12 ENCOUNTER — Ambulatory Visit
Admission: RE | Admit: 2022-05-12 | Discharge: 2022-05-12 | Disposition: A | Discharge status: Home / Self Care | Payer: BC Managed Care – PPO | Attending: Surgery | Admitting: Surgery

## 2022-05-12 ENCOUNTER — Other Ambulatory Visit: Payer: Self-pay

## 2022-05-12 DIAGNOSIS — I5022 Chronic systolic (congestive) heart failure: Secondary | ICD-10-CM | POA: Diagnosis not present

## 2022-05-12 DIAGNOSIS — K4090 Unilateral inguinal hernia, without obstruction or gangrene, not specified as recurrent: Secondary | ICD-10-CM | POA: Diagnosis not present

## 2022-05-12 DIAGNOSIS — Z87891 Personal history of nicotine dependence: Secondary | ICD-10-CM | POA: Diagnosis not present

## 2022-05-12 DIAGNOSIS — N400 Enlarged prostate without lower urinary tract symptoms: Secondary | ICD-10-CM | POA: Diagnosis not present

## 2022-05-12 DIAGNOSIS — K439 Ventral hernia without obstruction or gangrene: Secondary | ICD-10-CM

## 2022-05-12 DIAGNOSIS — I10 Essential (primary) hypertension: Secondary | ICD-10-CM | POA: Diagnosis not present

## 2022-05-12 DIAGNOSIS — K429 Umbilical hernia without obstruction or gangrene: Secondary | ICD-10-CM | POA: Insufficient documentation

## 2022-05-12 DIAGNOSIS — I110 Hypertensive heart disease with heart failure: Secondary | ICD-10-CM | POA: Diagnosis not present

## 2022-05-12 DIAGNOSIS — E785 Hyperlipidemia, unspecified: Secondary | ICD-10-CM | POA: Diagnosis not present

## 2022-05-12 DIAGNOSIS — I2510 Atherosclerotic heart disease of native coronary artery without angina pectoris: Secondary | ICD-10-CM | POA: Diagnosis not present

## 2022-05-12 HISTORY — PX: VENTRAL HERNIA REPAIR: SHX424

## 2022-05-12 HISTORY — DX: Unspecified systolic (congestive) heart failure: I50.20

## 2022-05-12 HISTORY — DX: Cannabis use, unspecified, uncomplicated: F12.90

## 2022-05-12 SURGERY — HERNIORRHAPHY, INGUINAL, ROBOT-ASSISTED, LAPAROSCOPIC
Anesthesia: General | Laterality: Right

## 2022-05-12 MED ORDER — BUPIVACAINE-EPINEPHRINE (PF) 0.25% -1:200000 IJ SOLN
INTRAMUSCULAR | Status: AC
  Filled 2022-05-12: qty 30

## 2022-05-12 MED ORDER — BUPIVACAINE LIPOSOME 1.3 % IJ SUSP: 20.0000 mL | Freq: Once | INTRAMUSCULAR | Status: DC

## 2022-05-12 MED ORDER — ONDANSETRON HCL 4 MG/2ML IJ SOLN
INTRAMUSCULAR | Status: DC | PRN
  Administered 2022-05-12 (×2): 4 mg via INTRAVENOUS

## 2022-05-12 MED ORDER — FAMOTIDINE 20 MG PO TABS
ORAL_TABLET | ORAL | Status: AC
  Administered 2022-05-12: 20 mg via ORAL
  Filled 2022-05-12: qty 1

## 2022-05-12 MED ORDER — FENTANYL CITRATE (PF) 100 MCG/2ML IJ SOLN: 25.0000 ug | INTRAMUSCULAR | Status: DC | PRN

## 2022-05-12 MED ORDER — LACTATED RINGERS IV SOLN: INTRAVENOUS | Status: DC | PRN

## 2022-05-12 MED ORDER — IBUPROFEN 600 MG PO TABS: 600.0000 mg | ORAL_TABLET | Freq: Three times a day (TID) | ORAL | 1 refills | Status: AC | PRN

## 2022-05-12 MED ORDER — SEVOFLURANE IN SOLN
RESPIRATORY_TRACT | Status: AC
  Filled 2022-05-12: qty 250

## 2022-05-12 MED ORDER — ACETAMINOPHEN 500 MG PO TABS
ORAL_TABLET | ORAL | Status: AC
  Administered 2022-05-12: 1000 mg via ORAL
  Filled 2022-05-12: qty 2

## 2022-05-12 MED ORDER — BUPIVACAINE LIPOSOME 1.3 % IJ SUSP
INTRAMUSCULAR | Status: AC
  Filled 2022-05-12: qty 20

## 2022-05-12 MED ORDER — PROPOFOL 10 MG/ML IV BOLUS
INTRAVENOUS | Status: DC | PRN
  Administered 2022-05-12: 200 mg via INTRAVENOUS

## 2022-05-12 MED ORDER — MIDAZOLAM HCL 2 MG/2ML IJ SOLN
INTRAMUSCULAR | Status: AC
  Filled 2022-05-12: qty 2

## 2022-05-12 MED ORDER — ACETAMINOPHEN 500 MG PO TABS: 1000.0000 mg | ORAL_TABLET | Freq: Four times a day (QID) | ORAL | Status: AC | PRN

## 2022-05-12 MED ORDER — FAMOTIDINE 20 MG PO TABS: 20.0000 mg | ORAL_TABLET | Freq: Once | ORAL | Status: AC

## 2022-05-12 MED ORDER — GABAPENTIN 300 MG PO CAPS
ORAL_CAPSULE | ORAL | Status: AC
  Administered 2022-05-12: 300 mg via ORAL
  Filled 2022-05-12: qty 1

## 2022-05-12 MED ORDER — CHLORHEXIDINE GLUCONATE CLOTH 2 % EX PADS
6.0000 | MEDICATED_PAD | Freq: Once | CUTANEOUS | Status: AC
  Administered 2022-05-12: 6 via TOPICAL

## 2022-05-12 MED ORDER — FENTANYL CITRATE (PF) 100 MCG/2ML IJ SOLN
INTRAMUSCULAR | Status: DC | PRN
  Administered 2022-05-12: 100 ug via INTRAVENOUS

## 2022-05-12 MED ORDER — BUPIVACAINE-EPINEPHRINE (PF) 0.5% -1:200000 IJ SOLN
INTRAMUSCULAR | Status: AC
  Filled 2022-05-12: qty 30

## 2022-05-12 MED ORDER — MIDAZOLAM HCL 2 MG/2ML IJ SOLN
INTRAMUSCULAR | Status: DC | PRN
  Administered 2022-05-12: 2 mg via INTRAVENOUS

## 2022-05-12 MED ORDER — LACTATED RINGERS IV SOLN: INTRAVENOUS | Status: DC

## 2022-05-12 MED ORDER — HYDROMORPHONE HCL 1 MG/ML IJ SOLN
INTRAMUSCULAR | Status: DC | PRN
  Administered 2022-05-12: 1 mg via INTRAVENOUS

## 2022-05-12 MED ORDER — OXYCODONE HCL 5 MG PO TABS
ORAL_TABLET | ORAL | Status: AC
  Filled 2022-05-12: qty 1

## 2022-05-12 MED ORDER — 0.9 % SODIUM CHLORIDE (POUR BTL) OPTIME
TOPICAL | Status: DC | PRN
  Administered 2022-05-12: 500 mL

## 2022-05-12 MED ORDER — CHLORHEXIDINE GLUCONATE 0.12 % MT SOLN
OROMUCOSAL | Status: AC
  Administered 2022-05-12: 15 mL via OROMUCOSAL
  Filled 2022-05-12: qty 15

## 2022-05-12 MED ORDER — OXYCODONE HCL 5 MG PO TABS
5.0000 mg | ORAL_TABLET | Freq: Once | ORAL | Status: AC
  Administered 2022-05-12: 5 mg via ORAL

## 2022-05-12 MED ORDER — LIDOCAINE HCL (CARDIAC) PF 100 MG/5ML IV SOSY
PREFILLED_SYRINGE | INTRAVENOUS | Status: DC | PRN
  Administered 2022-05-12: 100 mg via INTRAVENOUS

## 2022-05-12 MED ORDER — ROCURONIUM BROMIDE 100 MG/10ML IV SOLN
INTRAVENOUS | Status: DC | PRN
  Administered 2022-05-12: 20 mg via INTRAVENOUS
  Administered 2022-05-12: 40 mg via INTRAVENOUS
  Administered 2022-05-12: 20 mg via INTRAVENOUS
  Administered 2022-05-12: 10 mg via INTRAVENOUS
  Administered 2022-05-12: 40 mg via INTRAVENOUS
  Administered 2022-05-12: 70 mg via INTRAVENOUS

## 2022-05-12 MED ORDER — ACETAMINOPHEN 500 MG PO TABS: 1000.0000 mg | ORAL_TABLET | ORAL | Status: AC

## 2022-05-12 MED ORDER — CHLORHEXIDINE GLUCONATE 0.12 % MT SOLN: 15.0000 mL | Freq: Once | OROMUCOSAL | Status: AC

## 2022-05-12 MED ORDER — GLYCOPYRROLATE 0.2 MG/ML IJ SOLN
INTRAMUSCULAR | Status: DC | PRN
  Administered 2022-05-12: .2 mg via INTRAVENOUS

## 2022-05-12 MED ORDER — OXYCODONE HCL 5 MG PO TABS: 5.0000 mg | ORAL_TABLET | ORAL | 0 refills | Status: DC | PRN

## 2022-05-12 MED ORDER — DEXAMETHASONE SODIUM PHOSPHATE 10 MG/ML IJ SOLN
INTRAMUSCULAR | Status: DC | PRN
  Administered 2022-05-12: 10 mg via INTRAVENOUS

## 2022-05-12 MED ORDER — BUPIVACAINE-EPINEPHRINE (PF) 0.5% -1:200000 IJ SOLN
INTRAMUSCULAR | Status: DC | PRN
  Administered 2022-05-12: 30 mL via PERINEURAL

## 2022-05-12 MED ORDER — CEFAZOLIN SODIUM-DEXTROSE 2-4 GM/100ML-% IV SOLN
2.0000 g | INTRAVENOUS | Status: AC
  Administered 2022-05-12: 2 g via INTRAVENOUS

## 2022-05-12 MED ORDER — SUCCINYLCHOLINE CHLORIDE 200 MG/10ML IV SOSY
PREFILLED_SYRINGE | INTRAVENOUS | Status: DC | PRN
  Administered 2022-05-12: 100 mg via INTRAVENOUS

## 2022-05-12 MED ORDER — ORAL CARE MOUTH RINSE: 15.0000 mL | Freq: Once | OROMUCOSAL | Status: AC

## 2022-05-12 MED ORDER — PROMETHAZINE HCL 25 MG/ML IJ SOLN: 6.2500 mg | INTRAMUSCULAR | Status: DC | PRN

## 2022-05-12 MED ORDER — SUGAMMADEX SODIUM 200 MG/2ML IV SOLN
INTRAVENOUS | Status: DC | PRN
  Administered 2022-05-12: 400 mg via INTRAVENOUS

## 2022-05-12 MED ORDER — CEFAZOLIN SODIUM-DEXTROSE 2-4 GM/100ML-% IV SOLN
INTRAVENOUS | Status: AC
  Filled 2022-05-12: qty 100

## 2022-05-12 MED ORDER — GABAPENTIN 300 MG PO CAPS: 300.0000 mg | ORAL_CAPSULE | ORAL | Status: AC

## 2022-05-12 MED ORDER — HYDROMORPHONE HCL 1 MG/ML IJ SOLN
INTRAMUSCULAR | Status: AC
  Filled 2022-05-12: qty 1

## 2022-05-12 MED ORDER — BUPIVACAINE LIPOSOME 1.3 % IJ SUSP
INTRAMUSCULAR | Status: DC | PRN
  Administered 2022-05-12: 20 mL

## 2022-05-12 MED ORDER — FENTANYL CITRATE (PF) 100 MCG/2ML IJ SOLN
INTRAMUSCULAR | Status: AC
  Filled 2022-05-12: qty 2

## 2022-05-12 MED ORDER — PHENYLEPHRINE HCL-NACL 20-0.9 MG/250ML-% IV SOLN
INTRAVENOUS | Status: DC | PRN
  Administered 2022-05-12: 15 ug/min via INTRAVENOUS

## 2022-05-12 MED ORDER — DEXMEDETOMIDINE HCL IN NACL 80 MCG/20ML IV SOLN
INTRAVENOUS | Status: DC | PRN
  Administered 2022-05-12: 12 ug via INTRAVENOUS

## 2022-05-12 SURGICAL SUPPLY — 82 items
ADH SKN CLS APL DERMABOND .7 (GAUZE/BANDAGES/DRESSINGS) ×2
APL PRP STRL LF DISP 70% ISPRP (MISCELLANEOUS)
BLADE SURG 15 STRL LF DISP TIS (BLADE) ×2 IMPLANT
BLADE SURG 15 STRL SS (BLADE) ×2
CANNULA REDUC XI 12-8 STAPL (CANNULA) ×2
CANNULA REDUCER 12-8 DVNC XI (CANNULA) ×2 IMPLANT
CHLORAPREP W/TINT 26 (MISCELLANEOUS) ×2 IMPLANT
COVER TIP SHEARS 8 DVNC (MISCELLANEOUS) ×2 IMPLANT
COVER TIP SHEARS 8MM DA VINCI (MISCELLANEOUS) ×2
COVER WAND RF STERILE (DRAPES) ×2 IMPLANT
DERMABOND ADVANCED .7 DNX12 (GAUZE/BANDAGES/DRESSINGS) ×2 IMPLANT
DRAPE ARM DVNC X/XI (DISPOSABLE) ×6 IMPLANT
DRAPE COLUMN DVNC XI (DISPOSABLE) ×2 IMPLANT
DRAPE DA VINCI XI ARM (DISPOSABLE) ×6
DRAPE DA VINCI XI COLUMN (DISPOSABLE) ×2
DRAPE LAPAROTOMY 100X77 ABD (DRAPES) ×2 IMPLANT
DRAPE LAPAROTOMY 77X122 PED (DRAPES) ×2 IMPLANT
ELECT CAUTERY BLADE TIP 2.5 (TIP) ×2
ELECT REM PT RETURN 9FT ADLT (ELECTROSURGICAL) ×2
ELECTRODE CAUTERY BLDE TIP 2.5 (TIP) ×2 IMPLANT
ELECTRODE REM PT RTRN 9FT ADLT (ELECTROSURGICAL) ×2 IMPLANT
GAUZE 4X4 16PLY ~~LOC~~+RFID DBL (SPONGE) ×2 IMPLANT
GLOVE SURG SYN 7.0 (GLOVE) ×4 IMPLANT
GLOVE SURG SYN 7.0 PF PI (GLOVE) ×4 IMPLANT
GLOVE SURG SYN 7.5  E (GLOVE) ×4
GLOVE SURG SYN 7.5 E (GLOVE) ×4 IMPLANT
GLOVE SURG SYN 7.5 PF PI (GLOVE) ×4 IMPLANT
GOWN STRL REUS W/ TWL LRG LVL3 (GOWN DISPOSABLE) ×8 IMPLANT
GOWN STRL REUS W/TWL LRG LVL3 (GOWN DISPOSABLE) ×8
IRRIGATION STRYKERFLOW (MISCELLANEOUS) ×2 IMPLANT
IRRIGATOR STRYKERFLOW (MISCELLANEOUS) ×2
IV NS 1000ML (IV SOLUTION)
IV NS 1000ML BAXH (IV SOLUTION) IMPLANT
KIT PINK PAD W/HEAD ARE REST (MISCELLANEOUS) ×2
KIT PINK PAD W/HEAD ARM REST (MISCELLANEOUS) ×2 IMPLANT
LABEL OR SOLS (LABEL) ×2 IMPLANT
MANIFOLD NEPTUNE II (INSTRUMENTS) ×2 IMPLANT
MESH 3DMAX MID 4X6 RT LRG (Mesh General) IMPLANT
MESH VENTRALEX ST 8CM LRG (Mesh General) IMPLANT
NDL INSUFFLATION 14GA 120MM (NEEDLE) ×2 IMPLANT
NEEDLE HYPO 22GX1.5 SAFETY (NEEDLE) ×2 IMPLANT
NEEDLE INSUFFLATION 14GA 120MM (NEEDLE) ×2 IMPLANT
NS IRRIG 500ML POUR BTL (IV SOLUTION) ×2 IMPLANT
OBTURATOR OPTICAL STANDARD 8MM (TROCAR) ×2
OBTURATOR OPTICAL STND 8 DVNC (TROCAR) ×2
OBTURATOR OPTICALSTD 8 DVNC (TROCAR) ×2 IMPLANT
PACK BASIN MINOR ARMC (MISCELLANEOUS) ×2 IMPLANT
PACK LAP CHOLECYSTECTOMY (MISCELLANEOUS) ×2 IMPLANT
PENCIL SMOKE EVACUATOR (MISCELLANEOUS) ×2 IMPLANT
SEAL CANN UNIV 5-8 DVNC XI (MISCELLANEOUS) ×6 IMPLANT
SEAL XI 5MM-8MM UNIVERSAL (MISCELLANEOUS) ×6
SET TUBE SMOKE EVAC HIGH FLOW (TUBING) ×2 IMPLANT
SOLUTION ELECTROLUBE (MISCELLANEOUS) ×2 IMPLANT
SPONGE T-LAP 18X18 ~~LOC~~+RFID (SPONGE) ×2 IMPLANT
STAPLER CANNULA SEAL DVNC XI (STAPLE) ×2 IMPLANT
STAPLER CANNULA SEAL XI (STAPLE) ×2
SUT ETHIBOND 0 (SUTURE) ×2 IMPLANT
SUT ETHIBOND 0 MO6 C/R (SUTURE) ×2 IMPLANT
SUT MNCRL AB 4-0 PS2 18 (SUTURE) ×2 IMPLANT
SUT PROLENE 2 0 SH DA (SUTURE) ×2 IMPLANT
SUT SILK 0 (SUTURE)
SUT SILK 0 30XBRD TIE 6 (SUTURE) ×2 IMPLANT
SUT SILK 0 SH 30 (SUTURE) ×2 IMPLANT
SUT V-LOC 90 ABS 3-0 VLT  V-20 (SUTURE) ×2
SUT V-LOC 90 ABS 3-0 VLT V-20 (SUTURE) IMPLANT
SUT VIC AB 0 SH 27 (SUTURE) ×2 IMPLANT
SUT VIC AB 2-0 SH 27 (SUTURE) ×8
SUT VIC AB 2-0 SH 27XBRD (SUTURE) ×4 IMPLANT
SUT VIC AB 3-0 SH 27 (SUTURE) ×2
SUT VIC AB 3-0 SH 27X BRD (SUTURE) ×6 IMPLANT
SUT VICRYL 0 AB UR-6 (SUTURE) ×4 IMPLANT
SUT VLOC 90 6 CV-15 VIOLET (SUTURE) IMPLANT
SUT VLOC 90 S/L VL9 GS22 (SUTURE) ×2 IMPLANT
SYR 10ML LL (SYRINGE) ×2 IMPLANT
SYR 20ML LL LF (SYRINGE) ×2 IMPLANT
SYR BULB IRRIG 60ML STRL (SYRINGE) ×2 IMPLANT
SYS BAG RETRIEVAL 10MM (BASKET) ×2
SYSTEM BAG RETRIEVAL 10MM (BASKET) IMPLANT
TAPE TRANSPORE STRL 2 31045 (GAUZE/BANDAGES/DRESSINGS) ×2 IMPLANT
TRAP FLUID SMOKE EVACUATOR (MISCELLANEOUS) ×2 IMPLANT
TRAY FOLEY SLVR 16FR LF STAT (SET/KITS/TRAYS/PACK) ×2 IMPLANT
WATER STERILE IRR 500ML POUR (IV SOLUTION) ×2 IMPLANT

## 2022-05-12 NOTE — Discharge Instructions (Signed)
AMBULATORY SURGERY  ?DISCHARGE INSTRUCTIONS ? ? ?The drugs that you were given will stay in your system until tomorrow so for the next 24 hours you should not: ? ?Drive an automobile ?Make any legal decisions ?Drink any alcoholic beverage ? ? ?You may resume regular meals tomorrow.  Today it is better to start with liquids and gradually work up to solid foods. ? ?You may eat anything you prefer, but it is better to start with liquids, then soup and crackers, and gradually work up to solid foods. ? ? ?Please notify your doctor immediately if you have any unusual bleeding, trouble breathing, redness and pain at the surgery site, drainage, fever, or pain not relieved by medication. ? ? ? ?Additional Instructions: ? ? ? ?Please contact your physician with any problems or Same Day Surgery at [phone], Monday through Friday 6 am to 4 pm, or [place] [facility] [place] Main number at [phone].  ?

## 2022-05-12 NOTE — Anesthesia Procedure Notes (Signed)
Procedure Name: Intubation Date/Time: 05/12/2022 9:46 AM  Performed by: Kelton Pillar, CRNAPre-anesthesia Checklist: Patient identified, Emergency Drugs available, Suction available and Patient being monitored Patient Re-evaluated:Patient Re-evaluated prior to induction Oxygen Delivery Method: Circle system utilized Preoxygenation: Pre-oxygenation with 100% oxygen Induction Type: IV induction Ventilation: Mask ventilation without difficulty Laryngoscope Size: McGraph and 4 Grade View: Grade I Tube type: Oral Tube size: 7.0 mm Number of attempts: 1 Airway Equipment and Method: Stylet and Oral airway Placement Confirmation: ETT inserted through vocal cords under direct vision, positive ETCO2, breath sounds checked- equal and bilateral and CO2 detector Secured at: 21 cm Tube secured with: Tape Dental Injury: Teeth and Oropharynx as per pre-operative assessment

## 2022-05-12 NOTE — Interval H&P Note (Signed)
History and Physical Interval Note:  05/12/2022 9:13 AM  Caleb Spencer  has presented today for surgery, with the diagnosis of right inguinal hernia, ventral  hernia 3-7 cm.  The various methods of treatment have been discussed with the patient and family. After consideration of risks, benefits and other options for treatment, the patient has consented to  Procedure(s): XI ROBOTIC ASSISTED INGUINAL HERNIA (Right) HERNIA REPAIR UMBILICAL ADULT, open (N/A) HERNIA REPAIR VENTRAL ADULT, open (N/A) as a surgical intervention.  The patient's history has been reviewed, patient examined, no change in status, stable for surgery.  I have reviewed the patient's chart and labs.  Questions were answered to the patient's satisfaction.     Sabrena Gavitt

## 2022-05-12 NOTE — Transfer of Care (Signed)
Immediate Anesthesia Transfer of Care Note  Patient: Caleb Spencer  Procedure(s) Performed: XI ROBOTIC ASSISTED INGUINAL HERNIA (Right) HERNIA REPAIR VENTRAL ADULT, open  Patient Location: PACU  Anesthesia Type:General  Level of Consciousness: awake, alert  and oriented  Airway & Oxygen Therapy: Patient Spontanous Breathing and Patient connected to face mask oxygen  Post-op Assessment: Report given to RN and Post -op Vital signs reviewed and stable  Post vital signs: Reviewed and stable  Last Vitals:  Vitals Value Taken Time  BP 140/89 05/12/22 1322  Temp 36.9 C 05/12/22 1322  Pulse 81 05/12/22 1326  Resp 17 05/12/22 1326  SpO2 94 % 05/12/22 1326  Vitals shown include unvalidated device data.  Last Pain:  Vitals:   05/12/22 1322  TempSrc:   PainSc: 0-No pain         Complications: No notable events documented.

## 2022-05-12 NOTE — Anesthesia Preprocedure Evaluation (Signed)
Anesthesia Evaluation  Patient identified by MRN, date of birth, ID band Patient awake    Reviewed: Allergy & Precautions, H&P , NPO status , Patient's Chart, lab work & pertinent test results, reviewed documented beta blocker date and time   History of Anesthesia Complications (+) PROLONGED EMERGENCE and history of anesthetic complications  Airway Mallampati: I  TM Distance: >3 FB Neck ROM: full    Dental  (+) Dental Advidsory Given, Edentulous Upper, Edentulous Lower   Pulmonary neg pulmonary ROS, Patient abstained from smoking., former smoker,    Pulmonary exam normal breath sounds clear to auscultation       Cardiovascular Exercise Tolerance: Good hypertension, + CAD  (-) Past MI and (-) Cardiac Stents Normal cardiovascular exam(-) dysrhythmias (-) Valvular Problems/Murmurs Rhythm:regular Rate:Normal     Neuro/Psych negative neurological ROS  negative psych ROS   GI/Hepatic Neg liver ROS, GERD  ,  Endo/Other  negative endocrine ROS  Renal/GU negative Renal ROS  negative genitourinary   Musculoskeletal   Abdominal   Peds  Hematology negative hematology ROS (+)   Anesthesia Other Findings Past Medical History: No date: BPH (benign prostatic hyperplasia) No date: Complication of anesthesia     Comment:  a.) delayed emergence No date: Coronary artery disease     Comment:  a.) cCTA 01/22/2021: Ca score 510 (92nd percentile               age/sex match control) --> 1-24% p-mRCA, 25-49% pLAD,               1-24% pLCx --> FFR analysis normal No date: GERD (gastroesophageal reflux disease) No date: HFrEF (heart failure with reduced ejection fraction) ([facility])     Comment:  a.) TTE 10/23/2020: EF 45-50%, glob HK, mild MR,               mild-mod AR, mild AoV sclerosis with no stenosis, G2DD,               GLS -11.1% No date: History of kidney stones No date: History of smoking No date: Hyperlipidemia No date:  Hypertension No date: Left groin hernia No date: Occasional use of marijuana No date: Tuberculosis   Reproductive/Obstetrics negative OB ROS                             Anesthesia Physical Anesthesia Plan  ASA: 2  Anesthesia Plan: General   Post-op Pain Management:    Induction: Intravenous  PONV Risk Score and Plan: 2 and Ondansetron, Dexamethasone, Midazolam and Treatment may vary due to age or medical condition  Airway Management Planned: Oral ETT  Additional Equipment:   Intra-op Plan:   Post-operative Plan: Extubation in OR  Informed Consent: I have reviewed the patients History and Physical, chart, labs and discussed the procedure including the risks, benefits and alternatives for the proposed anesthesia with the patient or authorized representative who has indicated his/her understanding and acceptance.     Dental Advisory Given  Plan Discussed with: Anesthesiologist, CRNA and Surgeon  Anesthesia Plan Comments:         Anesthesia Quick Evaluation

## 2022-05-12 NOTE — Op Note (Signed)
Procedure Date:  05/12/2022  Pre-operative Diagnosis:  Right inguinal hernia, umbilical hernia and ventral hernia  Post-operative Diagnosis: Right inguinal hernia, umbilical hernia, and ventral hernia (combined length of 5 cm)  Procedure: 1.  Robotic assisted Right Inguinal Hernia Repair 2.  Creation of Right Posterior Rectus-Transversalis Fascia Advancment Flap for Coverage of Pelvic Wound (200 cm) 3.  Open umbilical and ventral hernia repair with mesh  Surgeon:  Melvyn Neth, MD  Assistant:  Geoffry Paradise, PA-S  Anesthesia:  General endotracheal  Estimated Blood Loss:  30 ml  Specimens:  None  Complications:  None  Indications for Procedure:  This is a 61 y.o. male who presents with a right inguinal hernia and also a combination of umbilical and ventral hernia.  The options of surgery versus observation were reviewed with the patient and/or family. The risks of bleeding, abscess or infection, recurrence of symptoms, potential for an open procedure, injury to surrounding structures, and chronic pain were all discussed with the patient and he was willing to proceed.  We have planned this transabdominal procedure with the creation of right peritoneal flap based on the posterior rectus sheath and transversalis fascia in order to fully cover the mesh, creating a natural tisssue barrier for the bowel and peritoneal cavity.  Description of Procedure: The patient was correctly identified in the preoperative area and brought into the operating room.  The patient was placed supine with VTE prophylaxis in place.  Appropriate time-outs were performed.  Anesthesia was induced and the patient was intubated.  Foley catheter was placed.  Appropriate antibiotics were infused.  The abdomen was prepped and draped in a sterile fashion. A supraumbilical incision was made. A cutdown technique was used to enter the abdominal cavity through the umbilical hernia defect without injury, and a Hasson  trocar was inserted.  Pneumoperitoneum was obtained with appropriate opening pressures.  A Veress needle was used to start dissecting the peritoneal flap.  Two 8-mm robotic ports were placed in the right and left lateral positions under direct visualization.  A large right Bard 3D Max Mesh, a 2-0 Vicryl, and 2-0 vlock suture were placed through the umbilical port under direct visualization.  The [employer] platform was docked onto the patient, the camera was inserted and targeted, and the instruments were placed under direct visualization.  Both inguinal regions were inspected for hernias and it was confirmed that the patient had a right inguinal hernia, which contained distal ileum.  First, the bowel was reduced back into the abdominal cavity without issue.  Using electocautery, the peritoneal and posterior rectus tissue flap was created.  The peritoneum on the right side was scored from the median umbilical ligament laterally towards the ASIS.  The flap was mobilized using robotic scissors and the bipolar instruments, creating a plane along the posterior rectus sheath and transversalis fascia down to the pubic tubercle medially. It was then further mobilized laterally across the inguinal canal and femoral vessels and onto the psoas muscle. The inferior epigastric vessels were identified and preserved. This created a posterior rectus and peritoneal flap measuring roughly 17 cm x 12 cm.  The hernia sac and contents were reduced preserving all structures.  The patient had a very large hernia sac, which was resected/trimmed down.  A large right Bard 3D Max Mid mesh was placed with good overlap along all the potential hernia defects and secured in place with 2-0 Vicryl along the medial superomedial and superolateral aspects.  Then, the peritoneal flap was advanced over  the mesh and carried over to close the defect. A running 2-0 V lock suture was used to approximate the edge of the flap onto the peritoneum.  An  additional 2-0 V lock suture was then used to close the peritoneal defect that was created when resecting the hernia sac.  All needles were removed under direct visualization.  The 8- mm ports were removed under direct visualization and the Hasson trocar was removed.  We then started dissecting the umbilical stalk off the fascia and he was found to have a total of three defects.  These contained preperitoneal fat.  The preperitoneal fat was resected without issue.  The three defects were combined into one main defect, which measured about 5 cm.  The fascial edges were cleared using cautery, and a ventralex hernia patch was inserted.  The tails were secured to the fascia and the fascia was then closed with multiple 0 Ethibond sutures, incorporating a layer of mesh with each bite.  Then, local anesthetic was infused in all incisions as well as a right ilioinguinal block.  The umbilical stalk was reattached to the fascia using 2-0 Vicryl and the incision was closed in layers using 2-0, 3-0 vicryl and 4-0 moncryl.  The remaining port incisions were closed with 4-0 Monocryl.  The wounds were cleaned and sealed with DermaBond.  Foley catheter was removed and the patient was emerged from anesthesia and extubated and brought to the recovery room for further management.  The patient tolerated the procedure well and all counts were correct at the end of the case.   Melvyn Neth, MD

## 2022-05-13 ENCOUNTER — Encounter: Payer: Self-pay | Admitting: Surgery

## 2022-05-24 NOTE — Anesthesia Postprocedure Evaluation (Signed)
Anesthesia Post Note  Patient: Caleb Spencer  Procedure(s) Performed: XI ROBOTIC ASSISTED INGUINAL HERNIA (Right) HERNIA REPAIR VENTRAL ADULT, open  Patient location during evaluation: PACU Anesthesia Type: General Level of consciousness: awake and alert Pain management: pain level controlled Vital Signs Assessment: post-procedure vital signs reviewed and stable Respiratory status: spontaneous breathing, nonlabored ventilation, respiratory function stable and patient connected to nasal cannula oxygen Cardiovascular status: blood pressure returned to baseline and stable Postop Assessment: no apparent nausea or vomiting Anesthetic complications: no   No notable events documented.   Last Vitals:  Vitals:   05/12/22 1407 05/12/22 1512  BP: (!) 167/92 (!) 175/92  Pulse: 84 75  Resp: 15 16  Temp: (!) 36.1 C   SpO2: 99% 99%    Last Pain:  Vitals:   05/12/22 1512  TempSrc:   PainSc: 4                  Martha Clan

## 2022-05-26 ENCOUNTER — Encounter: Payer: Self-pay | Admitting: Physician Assistant

## 2022-05-26 ENCOUNTER — Ambulatory Visit (INDEPENDENT_AMBULATORY_CARE_PROVIDER_SITE_OTHER): Payer: BC Managed Care – PPO | Admitting: Physician Assistant

## 2022-05-26 VITALS — BP 150/90 | HR 69 | Temp 98.0°F | Ht 71.0 in | Wt 194.0 lb

## 2022-05-26 DIAGNOSIS — Z09 Encounter for follow-up examination after completed treatment for conditions other than malignant neoplasm: Secondary | ICD-10-CM

## 2022-05-26 DIAGNOSIS — K4090 Unilateral inguinal hernia, without obstruction or gangrene, not specified as recurrent: Secondary | ICD-10-CM

## 2022-05-26 DIAGNOSIS — K439 Ventral hernia without obstruction or gangrene: Secondary | ICD-10-CM

## 2022-05-26 NOTE — Patient Instructions (Signed)
GENERAL POST-OPERATIVE PATIENT INSTRUCTIONS   WOUND CARE INSTRUCTIONS: Try to keep the wound dry and avoid ointments on the wound unless directed to do so.  If the wound becomes bright red and painful or starts to drain infected material that is not clear, please contact your physician immediately.  If the wound is mildly pink and has a thick firm ridge underneath it, this is normal, and is referred to as a healing ridge.  This will resolve over the next 4-6 weeks.  BATHING: You may shower if you have been informed of this by your surgeon. However, Please do not submerge in a tub, hot tub, or pool until incisions are completely sealed or have been told by your surgeon that you may do so.  DIET:  You may eat any foods that you can tolerate.  It is a good idea to eat a high fiber diet and take in plenty of fluids to prevent constipation.  If you do become constipated you may want to take a mild laxative or take ducolax tablets on a daily basis until your bowel habits are regular.  Constipation can be very uncomfortable, along with straining, after recent surgery.  ACTIVITY: You are encouraged to walk and engage in light activity for the next two weeks.  You should not lift more than 20 pounds for 6 weeks total after surgery as it could put you at increased risk for complications.  Twenty pounds is roughly equivalent to a plastic bag of groceries. At that time- Listen to your body when lifting, if you have pain when lifting, stop and then try again in a few days. Soreness after doing exercises or activities of daily living is normal as you get back in to your normal routine.  MEDICATIONS:  Try to take narcotic medications and anti-inflammatory medications, such as tylenol, ibuprofen, naprosyn, etc., with food.  This will minimize stomach upset from the medication.  Should you develop nausea and vomiting from the pain medication, or develop a rash, please discontinue the medication and contact your  physician.  You should not drive, make important decisions, or operate machinery when taking narcotic pain medication.  SUNBLOCK Use sun block to incision area over the next year if this area will be exposed to sun. This helps decrease scarring and will allow you avoid a permanent darkened area over your incision.  QUESTIONS:  Please feel free to call our office if you have any questions, and we will be glad to assist you. [phone]

## 2022-05-26 NOTE — Progress Notes (Signed)
[facility] POST-OP OFFICE VISIT  05/26/2022  HPI: Caleb Spencer is a 61 y.o. male 14 days s/p robotic assisted laparoscopic right inguinal hernia repair with open ventral/umbilical hernia repairs with Dr Kirke Corin   He is doing very well No complaints of pain; very happy Constipation the first few days but BM are regular now No fever, chills, nausea, emesis Incisions are well healed  Vital signs: BP (!) 150/90   Pulse 69   Temp 98 F (36.7 C)   Ht '5\' 11"'$  (1.803 m)   Wt 194 lb (88 kg)   SpO2 100%   BMI 27.06 kg/m    Physical Exam: Constitutional: Well appearing male, NAD Abdomen: Soft, non-tender, non-distended, no rebound/guarding Skin: Laparoscopic incisions are healing well, no erythema or drainage   Assessment/Plan: This is a 61 y.o. male 14 days s/p robotic assisted laparoscopic right inguinal hernia repair with open ventral/umbilical hernia repairs with Dr Kirke Corin    - Pain control prn  - Reviewed wound care recommendation  - Reviewed lifting restrictions; 6 weeks total; work note given   - He can follow up on as needed basis; He understands to call with questions/concerns  -- Edison Simon, PA-C [facility] 05/26/2022, 2:38 PM M-F: 7am - 4pm

## 2022-09-09 ENCOUNTER — Ambulatory Visit: Payer: BC Managed Care – PPO | Attending: Cardiology

## 2022-09-09 DIAGNOSIS — R9430 Abnormal result of cardiovascular function study, unspecified: Secondary | ICD-10-CM | POA: Diagnosis not present

## 2022-09-10 LAB — ECHOCARDIOGRAM COMPLETE
AR max vel: 2.78 cm2
AV Area VTI: 2.88 cm2
AV Area mean vel: 2.62 cm2
AV Mean grad: 3 mmHg
AV Peak grad: 5.3 mmHg
AV Vena cont: 0.5 cm
Ao pk vel: 1.15 m/s
Area-P 1/2: 3.61 cm2
Calc EF: 42.2 %
P 1/2 time: 770 msec
S' Lateral: 4.8 cm
Single Plane A2C EF: 43 %
Single Plane A4C EF: 41.9 %

## 2022-09-23 ENCOUNTER — Ambulatory Visit: Payer: BC Managed Care – PPO | Admitting: Cardiology

## 2022-10-28 ENCOUNTER — Ambulatory Visit: Payer: BC Managed Care – PPO | Attending: Cardiology | Admitting: Cardiology

## 2022-10-28 ENCOUNTER — Encounter: Payer: Self-pay | Admitting: Cardiology

## 2022-10-28 VITALS — BP 180/92 | HR 61 | Ht 72.0 in | Wt 198.0 lb

## 2022-10-28 DIAGNOSIS — I10 Essential (primary) hypertension: Secondary | ICD-10-CM | POA: Diagnosis not present

## 2022-10-28 DIAGNOSIS — I2510 Atherosclerotic heart disease of native coronary artery without angina pectoris: Secondary | ICD-10-CM

## 2022-10-28 DIAGNOSIS — E7800 Pure hypercholesterolemia, unspecified: Secondary | ICD-10-CM

## 2022-10-28 DIAGNOSIS — R9430 Abnormal result of cardiovascular function study, unspecified: Secondary | ICD-10-CM | POA: Diagnosis not present

## 2022-10-28 MED ORDER — EZETIMIBE 10 MG PO TABS: 10.0000 mg | ORAL_TABLET | Freq: Every day | ORAL | 3 refills | Status: DC

## 2022-10-28 MED ORDER — ENTRESTO 49-51 MG PO TABS: 1.0000 | ORAL_TABLET | Freq: Two times a day (BID) | ORAL | 3 refills | Status: DC

## 2022-10-28 NOTE — Progress Notes (Signed)
Cardiology Office Note:    Date:  10/28/2022   ID:  Caleb Spencer, DOB 1960-11-09, MRN [id number]  PCP:  Eustaquio Boyden, MD  [facility] Cardiologist:  Debbe Odea, MD  [facility] Electrophysiologist:  None   Referring MD: Eustaquio Boyden, MD   Chief Complaint  Patient presents with   6 month f/u/ feeling "swimmy headed" today    History of Present Illness:    Caleb Spencer is a 62 y.o. male with a hx of CAD (mild prox LAD disease, calcium score 510 on CCTA 02/2021),  hypertension, hyperlipidemia who presents for follow-up.    Being seen for CAD, hypertension.  States drinking last night which might have caused elevated blood pressures this morning.  Denies chest pain or shortness of breath.  Pravastatin caused significant hand cramping, symptoms improved with stopping pravastatin.  Prior notes Coronary CTA 01/2021 mild LAD disease, calcium score 510, FFR CT no significant stenosis Echo 10/2020 EF 45 to 50% mild to moderate AI Echocardiogram on 02/14/2020 showed normal systolic and diastolic function, EF 60%, mild AI.  No LVH noted on echocardiogram.  Past Medical History:  Diagnosis Date   BPH (benign prostatic hyperplasia)    Complication of anesthesia    a.) delayed emergence   Coronary artery disease    a.) cCTA 01/22/2021: Ca score 510 (92nd percentile age/sex match control) --> 1-24% p-mRCA, 25-49% pLAD, 1-24% pLCx --> FFR analysis normal   GERD (gastroesophageal reflux disease)    HFrEF (heart failure with reduced ejection fraction)    a.) TTE 10/23/2020: EF 45-50%, glob HK, mild MR, mild-mod AR, mild AoV sclerosis with no stenosis, G2DD, GLS -11.1%   History of kidney stones    History of smoking    Hyperlipidemia    Hypertension    Left groin hernia    Occasional use of marijuana    Tuberculosis     Past Surgical History:  Procedure Laterality Date   INGUINAL HERNIA REPAIR Left 07/18/1985   VENTRAL HERNIA REPAIR N/A 05/12/2022   Procedure:  HERNIA REPAIR VENTRAL ADULT, open;  Surgeon: Henrene Dodge, MD;  Location: [facility] ORS;  Service: General;  Laterality: N/A;    Current Medications: Current Meds  Medication Sig   acetaminophen (TYLENOL) 500 MG tablet Take 2 tablets (1,000 mg total) by mouth every 6 (six) hours as needed for mild pain.   aspirin EC 81 MG tablet Take 1 tablet (81 mg total) by mouth daily. Swallow whole.   carvedilol (COREG) 6.25 MG tablet Take 1 tablet (6.25 mg total) by mouth 2 (two) times daily.   ezetimibe (ZETIA) 10 MG tablet Take 1 tablet (10 mg total) by mouth daily.   ibuprofen (ADVIL) 600 MG tablet Take 1 tablet (600 mg total) by mouth every 8 (eight) hours as needed for moderate pain.   Multiple Vitamin (MULTIVITAMIN PO) Take 1 tablet by mouth daily.   sacubitril-valsartan (ENTRESTO) 49-51 MG Take 1 tablet by mouth 2 (two) times daily.   [DISCONTINUED] losartan (COZAAR) 25 MG tablet Take 1 tablet (25 mg total) by mouth daily. (Patient taking differently: Take 25 mg by mouth every morning.)     Allergies:   Imdur [isosorbide nitrate], Crestor [rosuvastatin], and Pravastatin   Social History   Socioeconomic History   Marital status: Single    Spouse name: Not on file   Number of children: Not on file   Years of education: 10th   Highest education level: Not on file  Occupational History   Occupation: fireproofing  and waterproofing    Employer: [employer]  Tobacco Use   Smoking status: Former    Packs/day: .25    Types: Cigarettes    Quit date: 2021    Years since quitting: 3.2   Smokeless tobacco: Never  Vaping Use   Vaping Use: Never used  Substance and Sexual Activity   Alcohol use: Yes    Comment: 2 beers nightly   Drug use: Yes    Types: Marijuana    Comment: occ   Sexual activity: Not on file  Other Topics Concern   Not on file  Social History Narrative   Lives alone, no pets   Occ: Southern Engineer, production   Activity: no regular exercise    Diet: good water,  increasing fruits/vegetables    Social Determinants of Corporate investment banker Strain: Not on file  Food Insecurity: Not on file  Transportation Needs: Not on file  Physical Activity: Not on file  Stress: Not on file  Social Connections: Not on file     Family History: The patient's family history includes Alcohol abuse in his father; Coronary artery disease in his father; Diabetes in his mother; Lung cancer in his maternal aunt. There is no history of Stroke.  ROS:   Please see the history of present illness.     All other systems reviewed and are negative.  EKGs/Labs/Other Studies Reviewed:    The following studies were reviewed today:   EKG:  EKG is  ordered today.  The ekg ordered today demonstrates sinus rhythm, heart rate 63  Recent Labs: 01/14/2022: ALT 21; BUN 16; Creatinine, Ser 0.92; Potassium 4.2; Sodium 138 05/11/2022: Hemoglobin 15.0; Platelets 204  Recent Lipid Panel    Component Value Date/Time   CHOL 218 (H) 01/14/2022 0940   CHOL 175 11/20/2020 1231   TRIG 100.0 01/14/2022 0940   HDL 61.80 01/14/2022 0940   HDL 73 11/20/2020 1231   CHOLHDL 4 01/14/2022 0940   VLDL 20.0 01/14/2022 0940   LDLCALC 136 (H) 01/14/2022 0940   LDLCALC 82 11/20/2020 1231   LDLDIRECT 87.0 05/15/2020 0756    Physical Exam:    VS:  BP (!) 180/92 (BP Location: Left Arm, Patient Position: Sitting, Cuff Size: Normal)   Pulse 61   Ht 6' (1.829 m)   Wt 198 lb (89.8 kg)   SpO2 98%   BMI 26.85 kg/m     Wt Readings from Last 3 Encounters:  10/28/22 198 lb (89.8 kg)  05/26/22 194 lb (88 kg)  05/12/22 203 lb 15.9 oz (92.5 kg)     GEN:  Well nourished, well developed in no acute distress HEENT: Normal NECK: No JVD; No carotid bruits CARDIAC: RRR, no murmurs, rubs, gallops RESPIRATORY:  Clear to auscultation without rales, wheezing or rhonchi  ABDOMEN: Soft, non-tender, non-distended MUSCULOSKELETAL:  No edema; No deformity  SKIN: Warm and dry NEUROLOGIC:  Alert and  oriented x 3 PSYCHIATRIC:  Normal affect   ASSESSMENT:    1. Coronary artery disease involving native coronary artery of native heart, unspecified whether angina present   2. Ejection fraction < 50%   3. Primary hypertension   4. Pure hypercholesterolemia    PLAN:    In order of problems listed above:  CAD, nonobstructive, mild calcified proximal LAD disease.  Denies chest pain.  Continue Coreg 6.25 twice daily, aspirin, start Zetia, stop Primacor. Mildly reduced EF of 45%.  Start Entresto 49-51 mg twice daily, continue Coreg 6.25 mg twice daily. stop  losartan. Hypertension, BP elevated.  Start Entresto 49-51 mg twice daily, continue Coreg.  Heart rate 63. Hyperlipidemia, myalgias with statins.  Start Zetia.  Follow-up in 3 months  Total encounter time 40 minutes  Greater than 50% was spent in counseling and coordination of care with the patient   Medication Adjustments/Labs and Tests Ordered: Current medicines are reviewed at length with the patient today.  Concerns regarding medicines are outlined above.  Orders Placed This Encounter  Procedures   EKG 12-Lead   Meds ordered this encounter  Medications   sacubitril-valsartan (ENTRESTO) 49-51 MG    Sig: Take 1 tablet by mouth 2 (two) times daily.    Dispense:  60 tablet    Refill:  3   ezetimibe (ZETIA) 10 MG tablet    Sig: Take 1 tablet (10 mg total) by mouth daily.    Dispense:  90 tablet    Refill:  3    Patient Instructions  Medication Instructions:   STOP Losartan  START Entresto - Taker one tablet ( 41/51mg ) by mouth twice a day.  START Zetia - take one tablet ( ) by mouth daily.   *If you need a refill on your cardiac medications before your next appointment, please call your pharmacy*   Lab Work:  None Ordered  If you have labs (blood work) drawn today and your tests are completely normal, you will receive your results only by: MyChart Message (if you have MyChart) OR A paper copy in the mail If  you have any lab test that is abnormal or we need to change your treatment, we will call you to review the results.   Testing/Procedures:  None Ordered   Follow-Up: At [facility], you and your health needs are our priority.  As part of our continuing mission to provide you with exceptional heart care, we have created designated Provider Care Teams.  These Care Teams include your primary Cardiologist (physician) and Advanced Practice Providers (APPs -  Physician Assistants and Nurse Practitioners) who all work together to provide you with the care you need, when you need it.  We recommend signing up for the patient portal called "MyChart".  Sign up information is provided on this After Visit Summary.  MyChart is used to connect with patients for Virtual Visits (Telemedicine).  Patients are able to view lab/test results, encounter notes, upcoming appointments, etc.  Non-urgent messages can be sent to your provider as well.   To learn more about what you can do with MyChart, go to [email or web address].    Your next appointment:   3 month(s)  Provider:   You may see Debbe Odea, MD or one of the following Advanced Practice Providers on your designated Care Team:   Nicolasa Ducking, NP Eula Listen, PA-C Cadence Fransico Michael, PA-C Charlsie Quest, NP   Signed, Debbe Odea, MD  10/28/2022 11:03 AM    [facility]

## 2022-10-28 NOTE — Patient Instructions (Addendum)
Medication Instructions:   STOP Losartan  START Entresto - Taker one tablet ( 41/51mg ) by mouth twice a day.  START Zetia - take one tablet ( 10mg ) by mouth daily.   *If you need a refill on your cardiac medications before your next appointment, please call your pharmacy*   Lab Work:  None Ordered  If you have labs (blood work) drawn today and your tests are completely normal, you will receive your results only by: MyChart Message (if you have MyChart) OR A paper copy in the mail If you have any lab test that is abnormal or we need to change your treatment, we will call you to review the results.   Testing/Procedures:  None Ordered   Follow-Up: At [facility], you and your health needs are our priority.  As part of our continuing mission to provide you with exceptional heart care, we have created designated Provider Care Teams.  These Care Teams include your primary Cardiologist (physician) and Advanced Practice Providers (APPs -  Physician Assistants and Nurse Practitioners) who all work together to provide you with the care you need, when you need it.  We recommend signing up for the patient portal called "MyChart".  Sign up information is provided on this After Visit Summary.  MyChart is used to connect with patients for Virtual Visits (Telemedicine).  Patients are able to view lab/test results, encounter notes, upcoming appointments, etc.  Non-urgent messages can be sent to your provider as well.   To learn more about what you can do with MyChart, go to [email or web address].    Your next appointment:   3 month(s)  Provider:   You may see Debbe Odea, MD or one of the following Advanced Practice Providers on your designated Care Team:   Nicolasa Ducking, NP Eula Listen, PA-C Cadence Fransico Michael, PA-C Charlsie Quest, NP

## 2022-11-23 ENCOUNTER — Other Ambulatory Visit: Payer: Self-pay

## 2022-11-23 MED ORDER — CARVEDILOL 6.25 MG PO TABS: 6.2500 mg | ORAL_TABLET | Freq: Two times a day (BID) | ORAL | 3 refills | Status: DC

## 2023-01-27 ENCOUNTER — Encounter: Payer: Self-pay | Admitting: Cardiology

## 2023-01-27 ENCOUNTER — Ambulatory Visit: Payer: Self-pay | Attending: Cardiology | Admitting: Cardiology

## 2023-01-27 VITALS — BP 124/82 | HR 62 | Ht 70.0 in | Wt 194.2 lb

## 2023-01-27 DIAGNOSIS — R9430 Abnormal result of cardiovascular function study, unspecified: Secondary | ICD-10-CM

## 2023-01-27 DIAGNOSIS — I10 Essential (primary) hypertension: Secondary | ICD-10-CM

## 2023-01-27 DIAGNOSIS — E7800 Pure hypercholesterolemia, unspecified: Secondary | ICD-10-CM

## 2023-01-27 DIAGNOSIS — I2510 Atherosclerotic heart disease of native coronary artery without angina pectoris: Secondary | ICD-10-CM

## 2023-01-27 NOTE — Addendum Note (Signed)
Addended by: Margrett Rud on: 01/27/2023 02:21 PM   Modules accepted: Orders

## 2023-01-27 NOTE — Patient Instructions (Signed)
Medication Instructions:   Your physician recommends that you continue on your current medications as directed. Please refer to the Current Medication list given to you today.  *If you need a refill on your cardiac medications before your next appointment, please call your pharmacy*   Lab Work:  None Ordered  If you have labs (blood work) drawn today and your tests are completely normal, you will receive your results only by: MyChart Message (if you have MyChart) OR A paper copy in the mail If you have any lab test that is abnormal or we need to change your treatment, we will call you to review the results.   Testing/Procedures:  Your physician has requested that you have an limited - echocardiogram - in 3 months. Echocardiography is a painless test that uses sound waves to create images of your heart. It provides your doctor with information about the size and shape of your heart and how well your heart's chambers and valves are working. This procedure takes approximately one hour. There are no restrictions for this procedure. Please do NOT wear cologne, perfume, aftershave, or lotions (deodorant is allowed). Please arrive 15 minutes prior to your appointment time.     Follow-Up: At [facility], you and your health needs are our priority.  As part of our continuing mission to provide you with exceptional heart care, we have created designated Provider Care Teams.  These Care Teams include your primary Cardiologist (physician) and Advanced Practice Providers (APPs -  Physician Assistants and Nurse Practitioners) who all work together to provide you with the care you need, when you need it.  We recommend signing up for the patient portal called "MyChart".  Sign up information is provided on this After Visit Summary.  MyChart is used to connect with patients for Virtual Visits (Telemedicine).  Patients are able to view lab/test results, encounter notes, upcoming appointments,  etc.  Non-urgent messages can be sent to your provider as well.   To learn more about what you can do with MyChart, go to [email or web address].    Your next appointment:   5 month(s)  Provider:   You may see Debbe Odea, MD or one of the following Advanced Practice Providers on your designated Care Team:   Nicolasa Ducking, NP Eula Listen, PA-C Cadence Fransico Michael, PA-C Charlsie Quest, NP

## 2023-01-27 NOTE — Progress Notes (Signed)
Cardiology Office Note:    Date:  01/27/2023   ID:  Caleb Spencer, DOB Jan 07, 1961, MRN [id number]  PCP:  Eustaquio Boyden, MD  [facility] Cardiologist:  Debbe Odea, MD  [facility] Electrophysiologist:  None   Referring MD: Eustaquio Boyden, MD   Chief Complaint  Patient presents with   Follow-up    Taking Sherryll Burger but is concerned that it could be causing occasional low bp.    History of Present Illness:    Caleb Spencer is a 62 y.o. male with a hx of CAD (mild prox LAD disease, calcium score 510 on CCTA 02/2021),  hypertension, hyperlipidemia who presents for follow-up.    Being seen for CAD, hypertension.  States drinking last night which might have caused elevated blood pressures this morning.  Denies chest pain or shortness of breath.  Pravastatin caused significant hand cramping, symptoms improved with stopping pravastatin.  Prior notes Coronary CTA 01/2021 mild LAD disease, calcium score 510, FFR CT no significant stenosis Echo 10/2020 EF 45 to 50% mild to moderate AI Echocardiogram on 02/14/2020 showed normal systolic and diastolic function, EF 60%, mild AI.  No LVH noted on echocardiogram.  Past Medical History:  Diagnosis Date   BPH (benign prostatic hyperplasia)    Complication of anesthesia    a.) delayed emergence   Coronary artery disease    a.) cCTA 01/22/2021: Ca score 510 (92nd percentile age/sex match control) --> 1-24% p-mRCA, 25-49% pLAD, 1-24% pLCx --> FFR analysis normal   GERD (gastroesophageal reflux disease)    HFrEF (heart failure with reduced ejection fraction) (HCC)    a.) TTE 10/23/2020: EF 45-50%, glob HK, mild MR, mild-mod AR, mild AoV sclerosis with no stenosis, G2DD, GLS -11.1%   History of kidney stones    History of smoking    Hyperlipidemia    Hypertension    Left groin hernia    Occasional use of marijuana    Tuberculosis     Past Surgical History:  Procedure Laterality Date   INGUINAL HERNIA REPAIR Left 07/18/1985    VENTRAL HERNIA REPAIR N/A 05/12/2022   Procedure: HERNIA REPAIR VENTRAL ADULT, open;  Surgeon: Henrene Dodge, MD;  Location: [facility] ORS;  Service: General;  Laterality: N/A;    Current Medications: Current Meds  Medication Sig   acetaminophen (TYLENOL) 500 MG tablet Take 2 tablets (1,000 mg total) by mouth every 6 (six) hours as needed for mild pain.   aspirin EC 81 MG tablet Take 1 tablet (81 mg total) by mouth daily. Swallow whole.   carvedilol (COREG) 6.25 MG tablet Take 1 tablet (6.25 mg total) by mouth 2 (two) times daily.   ezetimibe (ZETIA) 10 MG tablet Take 1 tablet (10 mg total) by mouth daily.   ibuprofen (ADVIL) 600 MG tablet Take 1 tablet (600 mg total) by mouth every 8 (eight) hours as needed for moderate pain.   Multiple Vitamin (MULTIVITAMIN PO) Take 1 tablet by mouth daily.   sacubitril-valsartan (ENTRESTO) 49-51 MG Take 1 tablet by mouth 2 (two) times daily.     Allergies:   Imdur [isosorbide nitrate], Crestor [rosuvastatin], and Pravastatin   Social History   Socioeconomic History   Marital status: Single    Spouse name: Not on file   Number of children: Not on file   Years of education: 10th   Highest education level: Not on file  Occupational History   Occupation: fireproofing and Systems analyst: [employer]  Tobacco Use   Smoking status: Former  Current packs/day: 0.00    Types: Cigarettes    Quit date: 2021    Years since quitting: 3.5   Smokeless tobacco: Never  Vaping Use   Vaping status: Never Used  Substance and Sexual Activity   Alcohol use: Yes    Comment: 2 beers nightly   Drug use: Yes    Types: Marijuana    Comment: occ   Sexual activity: Not on file  Other Topics Concern   Not on file  Social History Narrative   Lives alone, no pets   Occ: Southern Engineer, production   Activity: no regular exercise    Diet: good water, increasing fruits/vegetables    Social Determinants of Corporate investment banker Strain: Not  on file  Food Insecurity: Not on file  Transportation Needs: Not on file  Physical Activity: Not on file  Stress: Not on file  Social Connections: Not on file     Family History: The patient's family history includes Alcohol abuse in his father; Coronary artery disease in his father; Diabetes in his mother; Lung cancer in his maternal aunt. There is no history of Stroke.  ROS:   Please see the history of present illness.     All other systems reviewed and are negative.  EKGs/Labs/Other Studies Reviewed:    The following studies were reviewed today:   EKG:  EKG is  ordered today.  The ekg ordered today demonstrates sinus rhythm, heart rate 63  Recent Labs: 05/11/2022: Hemoglobin 15.0; Platelets 204  Recent Lipid Panel    Component Value Date/Time   CHOL 218 (H) 01/14/2022 0940   CHOL 175 11/20/2020 1231   TRIG 100.0 01/14/2022 0940   HDL 61.80 01/14/2022 0940   HDL 73 11/20/2020 1231   CHOLHDL 4 01/14/2022 0940   VLDL 20.0 01/14/2022 0940   LDLCALC 136 (H) 01/14/2022 0940   LDLCALC 82 11/20/2020 1231   LDLDIRECT 87.0 05/15/2020 0756    Physical Exam:    VS:  BP 124/82 (BP Location: Left Arm, Patient Position: Sitting, Cuff Size: Normal)   Pulse 62   Ht 5\' 10"  (1.778 m)   Wt 194 lb 3.2 oz (88.1 kg)   SpO2 98%   BMI 27.86 kg/m     Wt Readings from Last 3 Encounters:  01/27/23 194 lb 3.2 oz (88.1 kg)  10/28/22 198 lb (89.8 kg)  05/26/22 194 lb (88 kg)     GEN:  Well nourished, well developed in no acute distress HEENT: Normal NECK: No JVD; No carotid bruits CARDIAC: RRR, no murmurs, rubs, gallops RESPIRATORY:  Clear to auscultation without rales, wheezing or rhonchi  ABDOMEN: Soft, non-tender, non-distended MUSCULOSKELETAL:  No edema; No deformity  SKIN: Warm and dry NEUROLOGIC:  Alert and oriented x 3 PSYCHIATRIC:  Normal affect   ASSESSMENT:    1. Coronary artery disease involving native coronary artery of native heart, unspecified whether angina  present   2. Ejection fraction < 50%   3. Primary hypertension   4. Pure hypercholesterolemia     PLAN:    In order of problems listed above:  CAD, nonobstructive, mild calcified proximal LAD disease.  Denies chest pain.  Continue Coreg 6.25 twice daily, aspirin, Zetia, stop Primacor. Mildly reduced EF of 45%.  Continue Entresto 49-51 mg twice daily, Coreg 6.25 mg twice daily.  Obtain limited echo in 3 months.. Hypertension, BP controlled.  Continue Entresto 49-51 mg twice daily, continue Coreg.  Hyperlipidemia, myalgias with statins. Continue Zetia.  Repeat lipid panel  in 3 months.  Follow-up in 5 months  Total encounter time 40 minutes  Greater than 50% was spent in counseling and coordination of care with the patient   Medication Adjustments/Labs and Tests Ordered: Current medicines are reviewed at length with the patient today.  Concerns regarding medicines are outlined above.  Orders Placed This Encounter  Procedures   ECHOCARDIOGRAM LIMITED   No orders of the defined types were placed in this encounter.   Patient Instructions  Medication Instructions:   Your physician recommends that you continue on your current medications as directed. Please refer to the Current Medication list given to you today.  *If you need a refill on your cardiac medications before your next appointment, please call your pharmacy*   Lab Work:  None Ordered  If you have labs (blood work) drawn today and your tests are completely normal, you will receive your results only by: MyChart Message (if you have MyChart) OR A paper copy in the mail If you have any lab test that is abnormal or we need to change your treatment, we will call you to review the results.   Testing/Procedures:  Your physician has requested that you have an limited - echocardiogram - in 3 months. Echocardiography is a painless test that uses sound waves to create images of your heart. It provides your doctor with  information about the size and shape of your heart and how well your heart's chambers and valves are working. This procedure takes approximately one hour. There are no restrictions for this procedure. Please do NOT wear cologne, perfume, aftershave, or lotions (deodorant is allowed). Please arrive 15 minutes prior to your appointment time.     Follow-Up: At [facility], you and your health needs are our priority.  As part of our continuing mission to provide you with exceptional heart care, we have created designated Provider Care Teams.  These Care Teams include your primary Cardiologist (physician) and Advanced Practice Providers (APPs -  Physician Assistants and Nurse Practitioners) who all work together to provide you with the care you need, when you need it.  We recommend signing up for the patient portal called "MyChart".  Sign up information is provided on this After Visit Summary.  MyChart is used to connect with patients for Virtual Visits (Telemedicine).  Patients are able to view lab/test results, encounter notes, upcoming appointments, etc.  Non-urgent messages can be sent to your provider as well.   To learn more about what you can do with MyChart, go to [email or web address].    Your next appointment:   5 month(s)  Provider:   You may see Debbe Odea, MD or one of the following Advanced Practice Providers on your designated Care Team:   Nicolasa Ducking, NP Eula Listen, PA-C Cadence Fransico Michael, PA-C Charlsie Quest, NP   Signed, Debbe Odea, MD  01/27/2023 11:28 AM    [facility]

## 2023-01-30 ENCOUNTER — Telehealth: Payer: Self-pay | Admitting: Cardiology

## 2023-01-30 NOTE — Telephone Encounter (Signed)
Pt c/o medication issue:  1. Name of Medication: sacubitril-valsartan (ENTRESTO) 49-51 MG   2. How are you currently taking this medication (dosage and times per day)? Take 1 tablet by mouth 2 (two) times daily.   3. Are you having a reaction (difficulty breathing--STAT)? No  4. What is your medication issue? Patient is calling because he is having issues with his insurance. Patient can't afford the out of pocket expense for this medication. The patient stated that he is taking his last tablet today. The patient would like to know if he can get some samples to help him while he's trying to get his insurance situated. Please advise.

## 2023-01-30 NOTE — Telephone Encounter (Signed)
Left voicemail message for the pt to call the clinic. 

## 2023-01-31 MED ORDER — ENTRESTO 49-51 MG PO TABS: 1.0000 | ORAL_TABLET | Freq: Two times a day (BID) | ORAL | 0 refills | Status: DC

## 2023-01-31 NOTE — Telephone Encounter (Signed)
Patient called and asked for samples while insurance was processing medication. Informed patient he could pick up samples from this clinic at his earliest convenience.

## 2023-03-01 ENCOUNTER — Other Ambulatory Visit: Payer: Self-pay

## 2023-03-01 MED ORDER — CARVEDILOL 6.25 MG PO TABS: 6.2500 mg | ORAL_TABLET | Freq: Two times a day (BID) | ORAL | 4 refills | Status: DC

## 2023-03-01 NOTE — Telephone Encounter (Signed)
Requested Prescriptions   Signed Prescriptions Disp Refills   carvedilol (COREG) 6.25 MG tablet 60 tablet 4    Sig: Take 1 tablet (6.25 mg total) by mouth 2 (two) times daily.    Authorizing Provider: Debbe Odea    Ordering User: Guerry Minors

## 2023-03-08 ENCOUNTER — Telehealth: Payer: Self-pay | Admitting: Family Medicine

## 2023-03-08 NOTE — Telephone Encounter (Signed)
Left voicemail for patient to schedule annual physical

## 2023-03-10 ENCOUNTER — Ambulatory Visit: Payer: BC Managed Care – PPO

## 2023-03-10 DIAGNOSIS — I2510 Atherosclerotic heart disease of native coronary artery without angina pectoris: Secondary | ICD-10-CM | POA: Diagnosis not present

## 2023-03-10 LAB — ECHOCARDIOGRAM LIMITED
AV Vena cont: 0.5 cm
Area-P 1/2: 3.17 cm2
Calc EF: 46.8 %
P 1/2 time: 629 msec
S' Lateral: 5.2 cm
Single Plane A2C EF: 47 %
Single Plane A4C EF: 47 %

## 2023-04-10 ENCOUNTER — Other Ambulatory Visit: Payer: Self-pay | Admitting: *Deleted

## 2023-04-10 MED ORDER — ENTRESTO 49-51 MG PO TABS: 1.0000 | ORAL_TABLET | Freq: Two times a day (BID) | ORAL | 3 refills | Status: DC

## 2023-06-11 IMAGING — CT CT ANGIO CHEST
3 of 6 series · 18 of 36 positions shown · IV contrast (APPLIED)
Comparison: Report from a prior echocardiogram dated 10/23/2020

CLINICAL DATA: Aortic disease.

EXAM:
CT ANGIOGRAPHY CHEST WITH CONTRAST
TECHNIQUE: Multidetector CT imaging of the chest was performed using the
standard protocol during bolus administration of intravenous
contrast. Multiplanar CT image reconstructions and MIPs were
obtained to evaluate the vascular anatomy.
CONTRAST:  95mL OMNIPAQUE IOHEXOL 350 MG/ML SOLN

[Series 6: ax lung 74 % · axial · 0.74mm/px · z∈[-270,-164]mm · 5 of 116 slices shown]
[im 9/116  mediastinal]
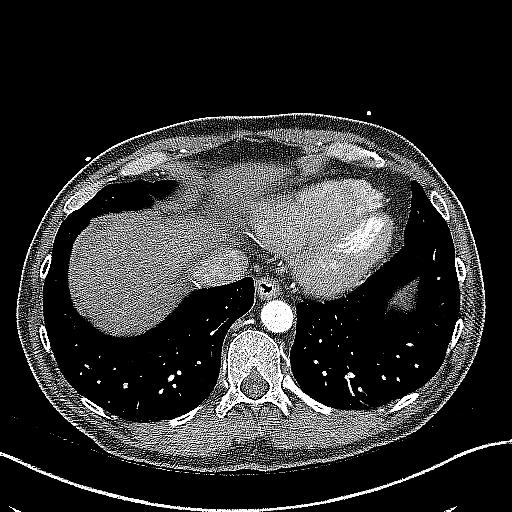
[im 27/116  mediastinal]
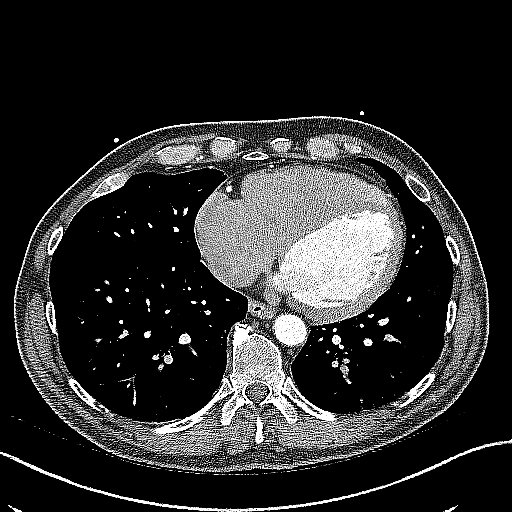
[im 36/116  mediastinal]
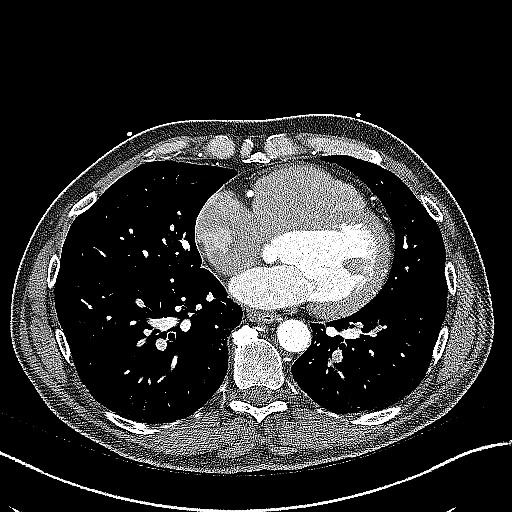
[im 54/116  mediastinal]
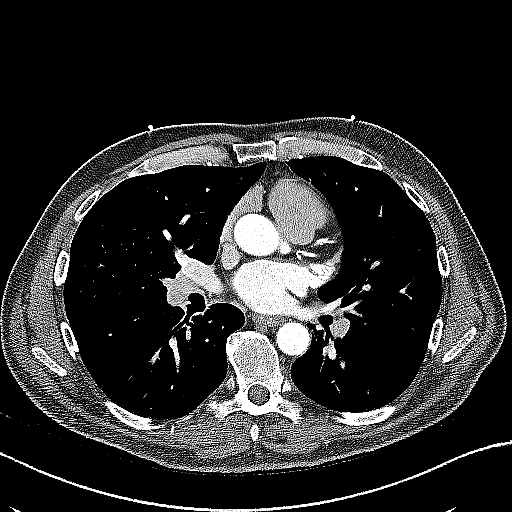
[im 62/116  mediastinal]
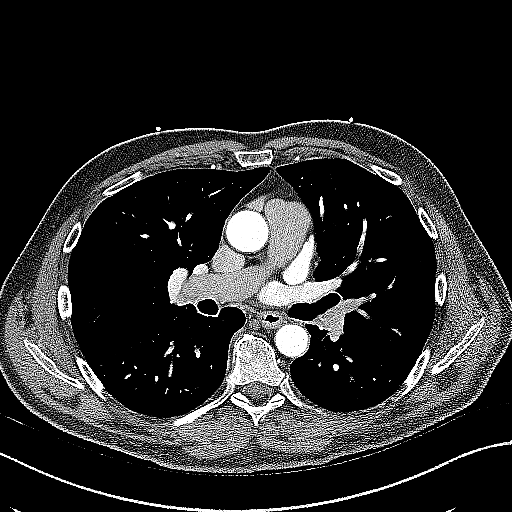

[Series 8: ax chest 74 % · axial · 0.74mm/px · z∈[-270,-74]mm · 12 of 116 slices shown]
[im 9/116  lung]
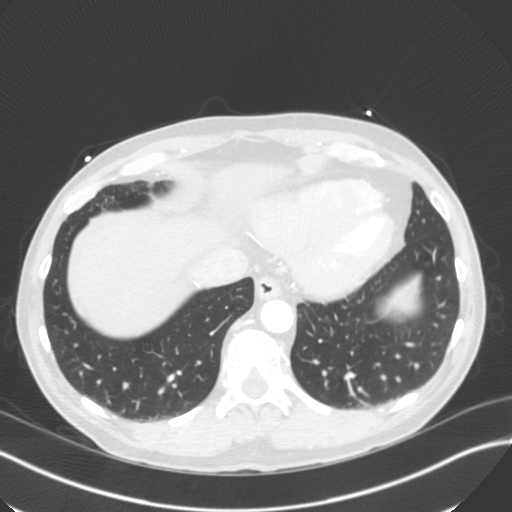
[im 18/116  mediastinal]
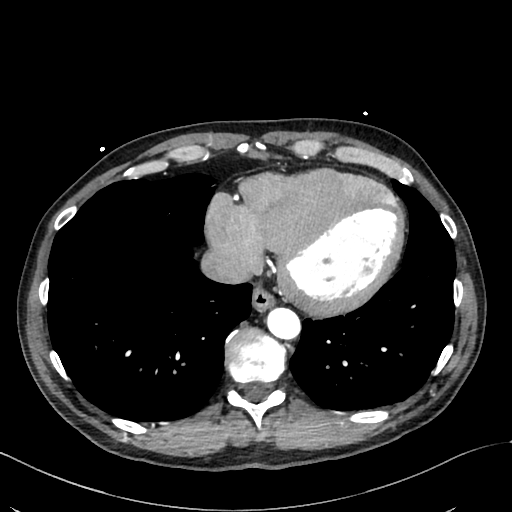
[im 27/116  lung]
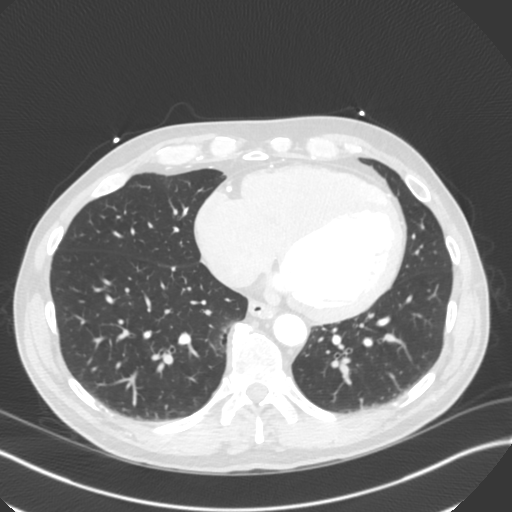
[im 36/116  mediastinal]
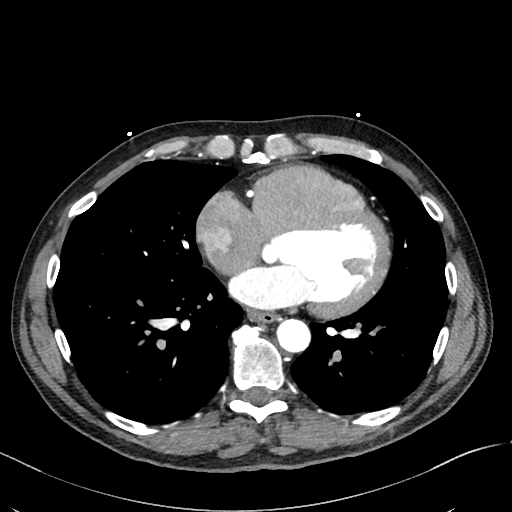
[im 45/116  lung]
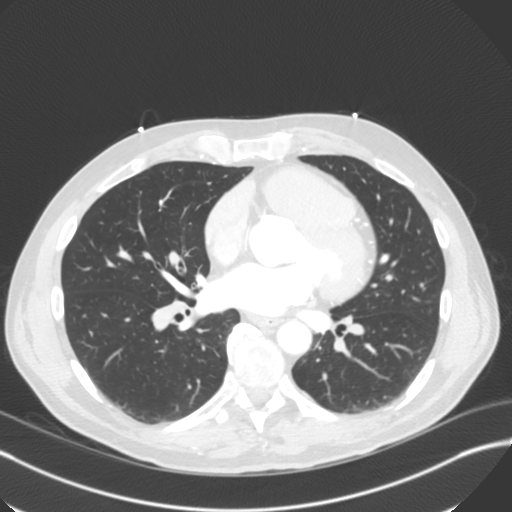
[im 54/116  mediastinal]
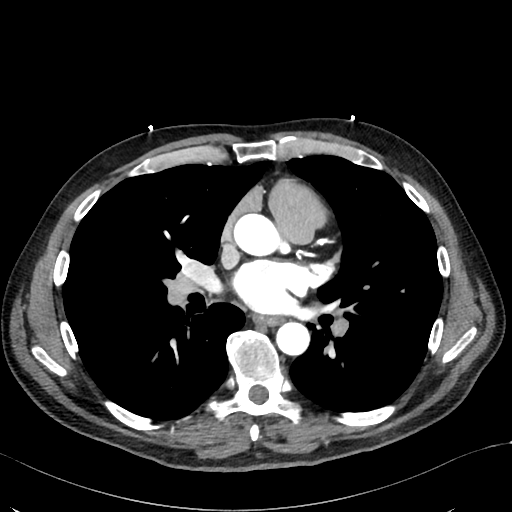
[im 62/116  lung]
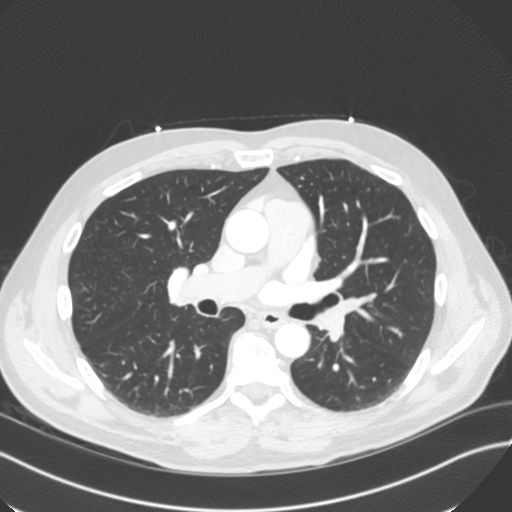
[im 71/116  mediastinal]
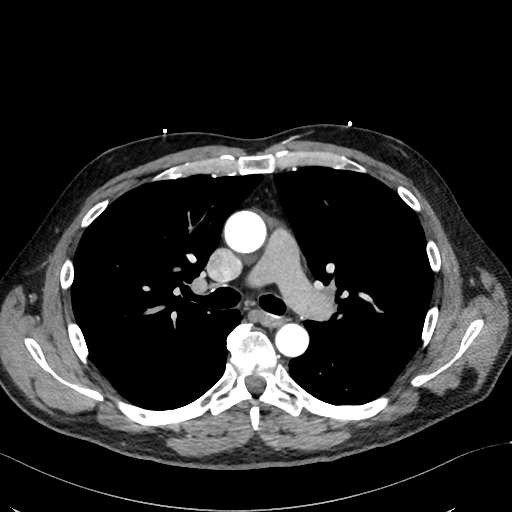
[im 80/116  lung]
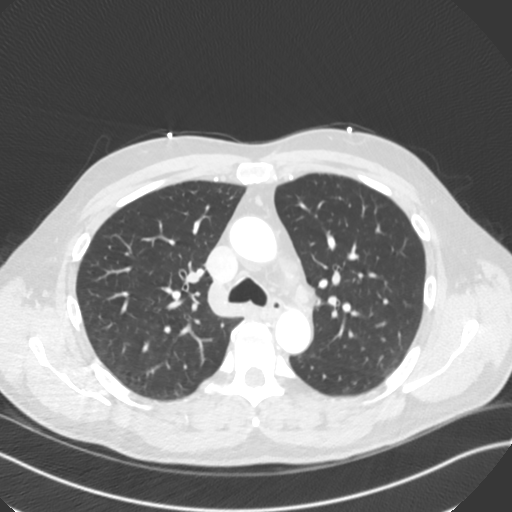
[im 89/116  mediastinal]
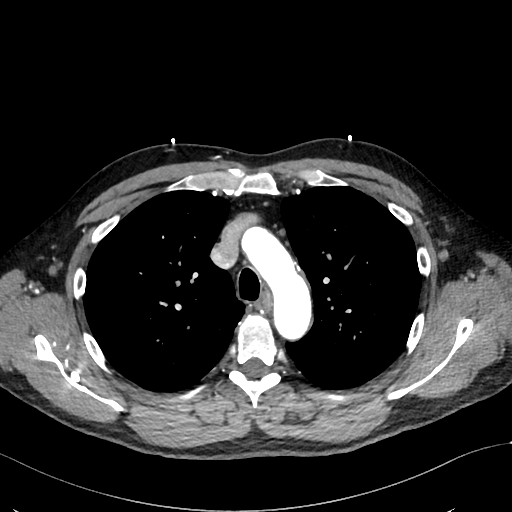
[im 98/116  lung]
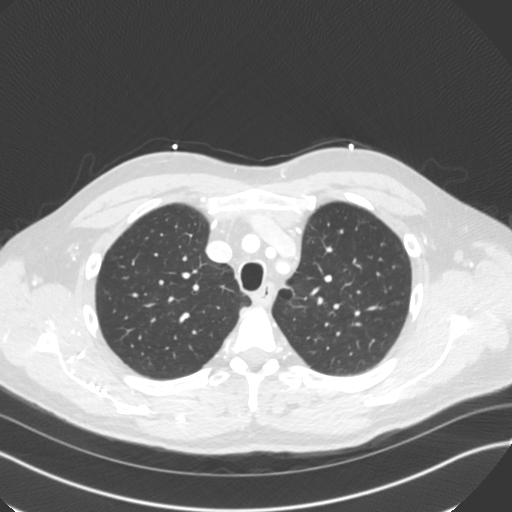
[im 107/116  mediastinal]
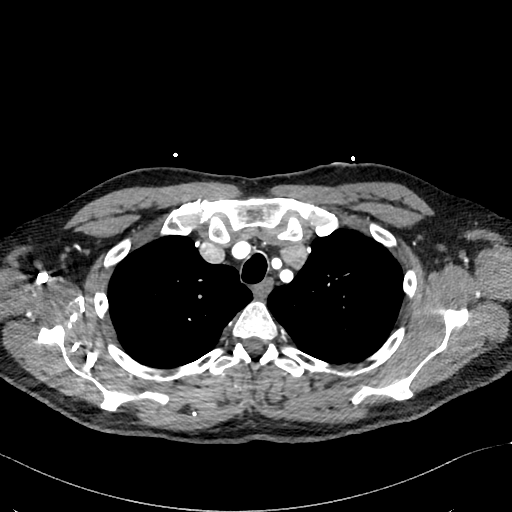

[Series 9: cor 74 % · coronal · 0.45mm/px · 1 of 191 slices shown]
[im 96/191  mediastinal]
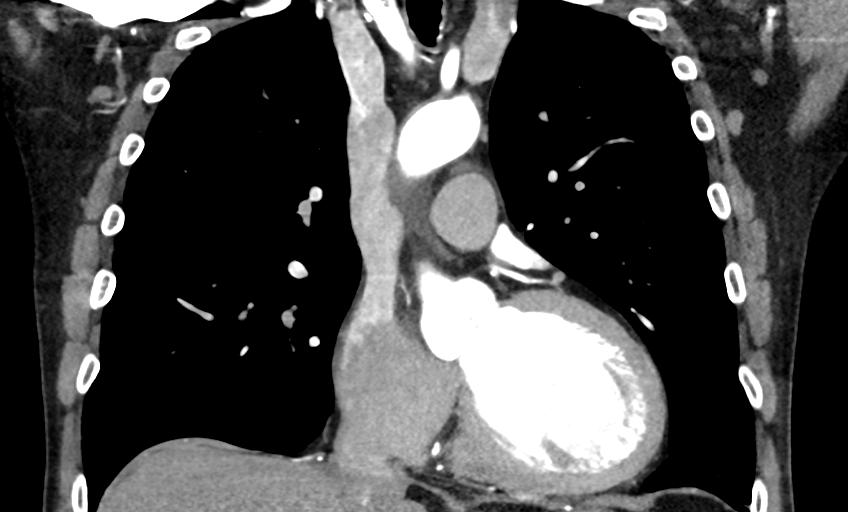

[18 of 36 positions shown; findings below may reference images not displayed]

FINDINGS: Cardiovascular: The aortic root measures approximately 3.9 cm at the
level of the sinuses of Valsalva. The ascending thoracic aorta is
normal in caliber and measures approximately 3.1 cm in greatest
diameter. The proximal arch measures 2.7 cm and the distal arch
cm. The descending thoracic aorta measures 2.4 cm. There is no
evidence of aortic dissection. Visualized proximal great vessels
demonstrate normal patency and branching anatomy with minimal
calcified plaque at the origins of the left common carotid and left
subclavian arteries.

The heart size is normal/top-normal. No pericardial fluid
identified. Central pulmonary arteries are normal in caliber.

Mediastinum/Nodes: No enlarged mediastinal, hilar, or axillary lymph
nodes. Thyroid gland, trachea, and esophagus demonstrate no
significant findings.

Lungs/Pleura: There is no evidence of pulmonary edema,
consolidation, pneumothorax, nodule or pleural fluid.

Upper Abdomen: No acute abnormality.

Musculoskeletal: No chest wall abnormality. No acute or significant
osseous findings.

Review of the MIP images confirms the above findings.
IMPRESSION: No evidence of aneurysmal disease of the thoracic aorta.

## 2023-06-30 ENCOUNTER — Encounter: Payer: Self-pay | Admitting: Cardiology

## 2023-06-30 ENCOUNTER — Ambulatory Visit: Payer: BC Managed Care – PPO | Attending: Cardiology | Admitting: Cardiology

## 2023-06-30 VITALS — BP 140/90 | HR 53 | Ht 72.0 in | Wt 206.4 lb

## 2023-06-30 DIAGNOSIS — I10 Essential (primary) hypertension: Secondary | ICD-10-CM

## 2023-06-30 DIAGNOSIS — R9430 Abnormal result of cardiovascular function study, unspecified: Secondary | ICD-10-CM

## 2023-06-30 DIAGNOSIS — E7800 Pure hypercholesterolemia, unspecified: Secondary | ICD-10-CM

## 2023-06-30 DIAGNOSIS — I2510 Atherosclerotic heart disease of native coronary artery without angina pectoris: Secondary | ICD-10-CM | POA: Diagnosis not present

## 2023-06-30 MED ORDER — CARVEDILOL 6.25 MG PO TABS: 6.2500 mg | ORAL_TABLET | Freq: Two times a day (BID) | ORAL | 4 refills | Status: DC

## 2023-06-30 MED ORDER — EZETIMIBE 10 MG PO TABS: 10.0000 mg | ORAL_TABLET | Freq: Every day | ORAL | 3 refills | Status: AC

## 2023-06-30 MED ORDER — SPIRONOLACTONE 25 MG PO TABS: 25.0000 mg | ORAL_TABLET | Freq: Every day | ORAL | 3 refills | Status: DC

## 2023-06-30 NOTE — Progress Notes (Signed)
Cardiology Office Note:    Date:  06/30/2023   ID:  Caleb Spencer, DOB 1961/06/05, MRN [id number]  PCP:  Eustaquio Boyden, MD  [facility] Cardiologist:  Debbe Odea, MD  [facility] Electrophysiologist:  None   Referring MD: Eustaquio Boyden, MD   Chief Complaint  Patient presents with   Follow-up    Patient reports a couple dizzy episodes this week with last episode yesterday.      History of Present Illness:    Caleb Spencer is a 62 y.o. male with a hx of CAD (mild prox LAD disease, calcium score 510 on CCTA 02/2021),  hypertension, hyperlipidemia who presents for follow-up.   States blood pressures have been elevated of late at home with systolics in the 140s to 150s associated with occasional dizziness.  Compliant with Coreg, Entresto as prescribed.  Ran out of Zetia the past month.  Denies chest pain or shortness of breath, denies edema.  States feeling well otherwise.  Prior notes Coronary CTA 01/2021 mild LAD disease, calcium score 510, FFR CT no significant stenosis Echo 10/2020 EF 45 to 50% mild to moderate AI Echocardiogram on 02/14/2020 showed normal systolic and diastolic function, EF 60%, mild AI.  No LVH noted on echocardiogram.  Past Medical History:  Diagnosis Date   BPH (benign prostatic hyperplasia)    Complication of anesthesia    a.) delayed emergence   Coronary artery disease    a.) cCTA 01/22/2021: Ca score 510 (92nd percentile age/sex match control) --> 1-24% p-mRCA, 25-49% pLAD, 1-24% pLCx --> FFR analysis normal   GERD (gastroesophageal reflux disease)    HFrEF (heart failure with reduced ejection fraction) (HCC)    a.) TTE 10/23/2020: EF 45-50%, glob HK, mild MR, mild-mod AR, mild AoV sclerosis with no stenosis, G2DD, GLS -11.1%   History of kidney stones    History of smoking    Hyperlipidemia    Hypertension    Left groin hernia    Occasional use of marijuana    Tuberculosis     Past Surgical History:  Procedure Laterality Date    INGUINAL HERNIA REPAIR Left 07/18/1985   VENTRAL HERNIA REPAIR N/A 05/12/2022   Procedure: HERNIA REPAIR VENTRAL ADULT, open;  Surgeon: Henrene Dodge, MD;  Location: [facility] ORS;  Service: General;  Laterality: N/A;    Current Medications: Current Meds  Medication Sig   acetaminophen (TYLENOL) 500 MG tablet Take 2 tablets (1,000 mg total) by mouth every 6 (six) hours as needed for mild pain.   aspirin EC 81 MG tablet Take 1 tablet (81 mg total) by mouth daily. Swallow whole.   ibuprofen (ADVIL) 600 MG tablet Take 1 tablet (600 mg total) by mouth every 8 (eight) hours as needed for moderate pain.   Multiple Vitamin (MULTIVITAMIN PO) Take 1 tablet by mouth daily.   sacubitril-valsartan (ENTRESTO) 49-51 MG Take 1 tablet by mouth 2 (two) times daily.   spironolactone (ALDACTONE) 25 MG tablet Take 1 tablet (25 mg total) by mouth daily.   [DISCONTINUED] carvedilol (COREG) 6.25 MG tablet Take 1 tablet (6.25 mg total) by mouth 2 (two) times daily.   [DISCONTINUED] ezetimibe (ZETIA) 10 MG tablet Take 1 tablet (10 mg total) by mouth daily.     Allergies:   Imdur [isosorbide nitrate], Crestor [rosuvastatin], and Pravastatin   Social History   Socioeconomic History   Marital status: Single    Spouse name: Not on file   Number of children: Not on file   Years of education: 10th  Highest education level: Not on file  Occupational History   Occupation: fireproofing and Systems analyst: [employer]  Tobacco Use   Smoking status: Former    Current packs/day: 0.00    Types: Cigarettes    Quit date: 2021    Years since quitting: 3.9   Smokeless tobacco: Never  Vaping Use   Vaping status: Never Used  Substance and Sexual Activity   Alcohol use: Yes    Comment: 2 beers nightly   Drug use: Yes    Types: Marijuana    Comment: occ   Sexual activity: Not on file  Other Topics Concern   Not on file  Social History Narrative   Lives alone, no pets   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 There is no history of Stroke.  ROS:   Please see the history of present illness.     All other systems reviewed and are negative.  EKGs/Labs/Other Studies Reviewed:    The following studies were reviewed today:   EKG:  EKG is  ordered today.  The ekg ordered today demonstrates sinus rhythm, heart rate 63  Recent Labs: No results found for requested labs within last 365 days.  Recent Lipid Panel    Component Value Date/Time   CHOL 218 (H) 01/14/2022 0940   CHOL 175 11/20/2020 1231   TRIG 100.0 01/14/2022 0940   HDL 61.80 01/14/2022 0940   HDL 73 11/20/2020 1231   CHOLHDL 4 01/14/2022 0940   VLDL 20.0 01/14/2022 0940   LDLCALC 136 (H) 01/14/2022 0940   LDLCALC 82 11/20/2020 1231   LDLDIRECT 87.0 05/15/2020 0756    Physical Exam:    VS:  BP (!) 140/90 (BP Location: Left Arm, Patient Position: Sitting, Cuff Size: Normal)   Pulse (!) 53   Ht 6' (1.829 m)   Wt 206 lb 6.4 oz (93.6 kg)   SpO2 99%   BMI 27.99 kg/m     Wt Readings from Last 3 Encounters:  06/30/23 206 lb 6.4 oz (93.6 kg)  01/27/23 194 lb 3.2 oz (88.1 kg)  10/28/22 198 lb (89.8 kg)     GEN:  Well nourished, well developed in no acute distress HEENT: Normal NECK: No JVD; No carotid bruits CARDIAC: RRR, no murmurs, rubs, gallops RESPIRATORY:  Clear to auscultation without rales, wheezing or rhonchi  ABDOMEN: Soft, non-tender, non-distended MUSCULOSKELETAL:  No edema; No deformity  SKIN: Warm and  dry NEUROLOGIC:  Alert and oriented x 3 PSYCHIATRIC:  Normal affect   ASSESSMENT:    1. Coronary artery disease involving native coronary artery of native heart, unspecified whether angina present   2. Ejection fraction < 50%   3. Primary hypertension   4. Pure hypercholesterolemia     PLAN:    In order of problems listed above:  CAD, nonobstructive, mild calcified proximal LAD disease.  Denies chest pain.  Continue Coreg 6.25 twice daily, aspirin, Zetia. Mildly reduced EF of 45%.  Limited echo 8/24  EF 45 to 50%.  Start spironolactone 25 mg daily.  Check BMP in 10 days.  Continue Coreg 6.25 mg twice daily, Entresto 49-51 mg twice daily.  Hypertension, BP elevated.  Start spironolactone 25 mg daily.  Continue Coreg, Entresto as above Hyperlipidemia, myalgias with statins.  Compliance with Zetia advised.  Lipid panel in 3 months.  Follow-up in 3 months  Medication Adjustments/Labs and Tests Ordered: Current medicines are reviewed at length with the patient today.  Concerns regarding medicines are outlined above.  Orders Placed This Encounter  Procedures   Lipid panel   Basic Metabolic Panel (BMET)   EKG 12-Lead   Meds ordered this encounter  Medications   carvedilol (COREG) 6.25 MG tablet    Sig: Take 1 tablet (6.25 mg total) by mouth 2 (two) times daily.    Dispense:  60 tablet    Refill:  4   ezetimibe (ZETIA) 10 MG tablet    Sig: Take 1 tablet (10 mg total) by mouth daily.    Dispense:  90 tablet    Refill:  3   spironolactone (ALDACTONE) 25 MG tablet    Sig: Take 1 tablet (25 mg total) by mouth daily.    Dispense:  90 tablet    Refill:  3    Patient Instructions  Medication Instructions:   START Spirolactone - Take one tablet ( 25mg ) by mouth daily.   *If you need a refill on your cardiac medications before your next appointment, please call your pharmacy*   Lab Work:  Your provider would like for you to return in 10 days to have the following labs drawn:  BMP.   Please go to [facility] [address] ([facility] [address], [place] [address] You do not need an appointment.  They are open from 7:30 am-4 pm.  Lunch from 1:00 pm- 2:00 pm You WILL NOT need to be fasting.   Your provider would like for you to return 1 week before your 3 month appt to have the following labs drawn: LIPID.   Please go to [facility] [address] ([facility] [address], [place] [address] You do not need an appointment.  They are open from 7:30 am-4 pm.  Lunch from 1:00 pm- 2:00 pm You WILL need to be fasting.   If you have labs (blood work) drawn today and your tests are completely normal, you will receive your results only by: MyChart Message (if you have MyChart) OR A paper copy in the mail If you have any lab test that is abnormal or we need to change your treatment, we will call you to review the results.   Testing/Procedures:  None Ordered    Follow-Up: At [facility], you and your health needs are our priority.  As part of our continuing mission to provide you with exceptional heart care, we have created designated Provider Care Teams.  These Care Teams include your primary Cardiologist (physician) and Advanced Practice Providers (APPs -  Physician Assistants and Nurse Practitioners) who all work together to provide you with the care you need, when you need it.  We recommend signing up for the patient portal called "MyChart".  Sign up information is provided on this After Visit Summary.  MyChart is used to connect with patients for Virtual Visits (Telemedicine).  Patients are able to view lab/test results, encounter notes, upcoming appointments, etc.  Non-urgent messages can be sent to your provider as well.   To learn more about what  you can do with MyChart, go to [email or web address].    Your next appointment:   3 month(s)  Provider:   You may see Debbe Odea, MD or one of the  following Advanced Practice Providers on your designated Care Team:   Nicolasa Ducking, NP Eula Listen, PA-C Cadence Fransico Michael, PA-C Charlsie Quest, NP Carlos Levering, NP    Signed, Debbe Odea, MD  06/30/2023 10:41 AM    [facility]

## 2023-06-30 NOTE — Patient Instructions (Signed)
Medication Instructions:   START Spirolactone - Take one tablet ( 25mg ) by mouth daily.   *If you need a refill on your cardiac medications before your next appointment, please call your pharmacy*   Lab Work:  Your provider would like for you to return in 10 days to have the following labs drawn: BMP.   Please go to [facility] [address] ([facility]) [address], [place] [address] You do not need an appointment.  They are open from 7:30 am-4 pm.  Lunch from 1:00 pm- 2:00 pm You WILL NOT need to be fasting.   Your provider would like for you to return 1 week before your 3 month appt to have the following labs drawn: LIPID.   Please go to [facility] [address] ([facility] [address]) [address], [place] [address] You do not need an appointment.  They are open from 7:30 am-4 pm.  Lunch from 1:00 pm- 2:00 pm You WILL need to be fasting.   If you have labs (blood work) drawn today and your tests are completely normal, you will receive your results only by: MyChart Message (if you have MyChart) OR A paper copy in the mail If you have any lab test that is abnormal or we need to change your treatment, we will call you to review the results.   Testing/Procedures:  None Ordered    Follow-Up: At [facility], you and your health needs are our priority.  As part of our continuing mission to provide you with exceptional heart care, we have created designated Provider Care Teams.  These Care Teams include your primary Cardiologist (physician) and Advanced Practice Providers (APPs -  Physician Assistants and Nurse Practitioners) who all work together to provide you with the care you need, when you need it.  We recommend signing up for the patient portal called "MyChart".  Sign up information is provided on this After Visit Summary.  MyChart is used to connect with patients for Virtual Visits (Telemedicine).  Patients are able to view lab/test  results, encounter notes, upcoming appointments, etc.  Non-urgent messages can be sent to your provider as well.   To learn more about what you can do with MyChart, go to [email or web address].    Your next appointment:   3 month(s)  Provider:   You may see Debbe Odea, MD or one of the following Advanced Practice Providers on your designated Care Team:   Nicolasa Ducking, NP Eula Listen, PA-C Cadence Fransico Michael, PA-C Charlsie Quest, NP Carlos Levering, NP

## 2023-07-14 ENCOUNTER — Other Ambulatory Visit: Payer: Self-pay

## 2023-07-14 DIAGNOSIS — I2510 Atherosclerotic heart disease of native coronary artery without angina pectoris: Secondary | ICD-10-CM | POA: Diagnosis not present

## 2023-07-14 DIAGNOSIS — E7800 Pure hypercholesterolemia, unspecified: Secondary | ICD-10-CM | POA: Diagnosis not present

## 2023-07-15 LAB — LIPID PANEL
Chol/HDL Ratio: 3.7 {ratio} (ref 0.0–5.0)
Cholesterol, Total: 250 mg/dL — ABNORMAL HIGH (ref 100–199)
HDL: 67 mg/dL (ref >39)
LDL Chol Calc (NIH): 150 mg/dL — ABNORMAL HIGH (ref 0–99)
Triglycerides: 184 mg/dL — ABNORMAL HIGH (ref 0–149)
VLDL Cholesterol Cal: 33 mg/dL (ref 5–40)

## 2023-07-15 LAB — BASIC METABOLIC PANEL
BUN/Creatinine Ratio: 16 (ref 10–24)
BUN: 16 mg/dL (ref 8–27)
CO2: 22 mmol/L (ref 20–29)
Calcium: 9.6 mg/dL (ref 8.6–10.2)
Chloride: 102 mmol/L (ref 96–106)
Creatinine, Ser: 1.01 mg/dL (ref 0.76–1.27)
Glucose: 107 mg/dL — ABNORMAL HIGH (ref 70–99)
Potassium: 4 mmol/L (ref 3.5–5.2)
Sodium: 143 mmol/L (ref 134–144)
eGFR: 85 mL/min/{1.73_m2} (ref >59)

## 2023-07-20 ENCOUNTER — Other Ambulatory Visit: Payer: Self-pay

## 2023-07-20 MED ORDER — BEMPEDOIC ACID 180 MG PO TABS: 1.0000 | ORAL_TABLET | Freq: Every day | ORAL | 3 refills | Status: AC

## 2023-08-02 ENCOUNTER — Telehealth: Payer: Self-pay | Admitting: Cardiology

## 2023-08-02 NOTE — Telephone Encounter (Signed)
*  STAT* If patient is at the pharmacy, call can be transferred to refill team.   1. Which medications need to be refilled? (please list name of each medication and dose if known) Carvedilol - pharmacist said they did not have this medicinel   2. Would you like to learn more about the convenience, safety, & potential cost savings by using the [facility]?     3. Are you open to using the [facility] (Type [facility].   4. Which pharmacy/location (including street and city if local pharmacy) is medication to be sent to?[facility]  [address], [place]   5. Do they need a 30 day or 90 day supply? 90  days # 180 and refills- please call in today- out of medicine

## 2023-08-02 NOTE — Telephone Encounter (Signed)
 Disp Refills Start End   carvedilol  (COREG ) 6.25 MG tablet 60 tablet 4 06/30/2023 --   Sig - Route: Take 1 tablet (6.25 mg total) by mouth 2 (two) times daily. - Oral   Sent to pharmacy as: carvedilol  (COREG ) 6.25 MG tablet   E-Prescribing Status: Receipt confirmed by pharmacy (06/30/2023 10:17 AM EST)     [facility] [place] to inquire about the carvedilol  prescription that was sent on 06-30-2023. The staff state that they do have the order on file and will fill the order.

## 2023-08-07 ENCOUNTER — Other Ambulatory Visit (HOSPITAL_COMMUNITY): Payer: Self-pay

## 2023-08-07 ENCOUNTER — Telehealth: Payer: Self-pay | Admitting: Pharmacy Technician

## 2023-08-07 NOTE — Telephone Encounter (Signed)
Pharmacy Patient Advocate Encounter   Received notification from Fax that prior authorization for Nexletol 180MG  tablets is required/requested.   Insurance verification completed.   The patient is insured through [facility] .   Per test claim: PA required; PA submitted to above mentioned insurance via CoverMyMeds Key/confirmation #/EOC [id number] Status is pending

## 2023-08-09 NOTE — Telephone Encounter (Signed)
Insurance requested more information due to them not taking cover my meds. Submitted via fax and answered questions over the phone. Rx case [id number]. Phone [phone] opt 3, opt 4, opt 2

## 2023-08-10 ENCOUNTER — Other Ambulatory Visit (HOSPITAL_COMMUNITY): Payer: Self-pay

## 2023-08-10 NOTE — Telephone Encounter (Signed)
Pharmacy Patient Advocate Encounter  Received notification from [facility] that Prior Authorization for nexletol has been APPROVED from 08/07/23 to 08/06/24. Ran test claim, Copay is $287.35 one month. This test claim was processed through [facility]- copay amounts may vary at other pharmacies due to pharmacy/plan contracts, or as the patient moves through the different stages of their insurance plan.   PA #/Case ID/Reference #: [id number]

## 2023-08-28 ENCOUNTER — Other Ambulatory Visit: Payer: Self-pay

## 2023-08-28 MED ORDER — SACUBITRIL-VALSARTAN 49-51 MG PO TABS: 1.0000 | ORAL_TABLET | Freq: Two times a day (BID) | ORAL | 1 refills | Status: DC

## 2023-08-28 NOTE — Telephone Encounter (Signed)
 Requested Prescriptions   Signed Prescriptions Disp Refills   sacubitril -valsartan  (ENTRESTO ) 49-51 MG 60 tablet 1    Sig: Take 1 tablet by mouth 2 (two) times daily.    Authorizing Provider: Constancia Delton    Ordering User: Duayne Gey   Last office visit:  06/30/23 with plan to f/u in 3 months Next office visit:  10/06/23

## 2023-10-06 ENCOUNTER — Ambulatory Visit: Payer: BC Managed Care – PPO | Admitting: Cardiology

## 2023-10-31 ENCOUNTER — Other Ambulatory Visit: Payer: Self-pay

## 2023-10-31 MED ORDER — CARVEDILOL 6.25 MG PO TABS: 6.2500 mg | ORAL_TABLET | Freq: Two times a day (BID) | ORAL | 1 refills | Status: DC

## 2023-11-06 ENCOUNTER — Other Ambulatory Visit: Payer: Self-pay

## 2023-11-06 MED ORDER — ENTRESTO 49-51 MG PO TABS: 1.0000 | ORAL_TABLET | Freq: Two times a day (BID) | ORAL | 1 refills | Status: DC

## 2023-11-10 ENCOUNTER — Ambulatory Visit: Attending: Cardiology | Admitting: Cardiology

## 2023-11-10 ENCOUNTER — Encounter: Payer: Self-pay | Admitting: Cardiology

## 2023-11-10 ENCOUNTER — Ambulatory Visit

## 2023-11-10 VITALS — BP 118/62 | HR 59 | Ht 72.0 in | Wt 198.2 lb

## 2023-11-10 DIAGNOSIS — I10 Essential (primary) hypertension: Secondary | ICD-10-CM | POA: Diagnosis not present

## 2023-11-10 DIAGNOSIS — I2510 Atherosclerotic heart disease of native coronary artery without angina pectoris: Secondary | ICD-10-CM

## 2023-11-10 DIAGNOSIS — E7800 Pure hypercholesterolemia, unspecified: Secondary | ICD-10-CM

## 2023-11-10 DIAGNOSIS — R931 Abnormal findings on diagnostic imaging of heart and coronary circulation: Secondary | ICD-10-CM

## 2023-11-10 DIAGNOSIS — R55 Syncope and collapse: Secondary | ICD-10-CM | POA: Diagnosis not present

## 2023-11-10 NOTE — Patient Instructions (Signed)
 Medication Instructions:  Your Physician recommend you continue on your current medication as directed.     *If you need a refill on your cardiac medications before your next appointment, please call your pharmacy*  Lab Work: Your provider would like for you to return in 3 months to have the following labs drawn: lipid panel.  Please go to [facility] [address] ([facility] [address]) [address], [place] [address] You do not need an appointment.  They are open from 8 am- 4:30 pm.  Lunch from 1:00 pm- 2:00 pm You DO need to be fasting.   Testing/Procedures: Your physician has requested that you have an echocardiogram. Echocardiography is a painless test that uses sound waves to create images of your heart. It provides your doctor with information about the size and shape of your heart and how well your heart's chambers and valves are working.   You may receive an ultrasound enhancing agent through an IV if needed to better visualize your heart during the echo. This procedure takes approximately one hour.  There are no restrictions for this procedure.  This will take place at [address] [facility]) [address], [place] [address]  Please note: We ask at that you not bring children with you during ultrasound (echo/ vascular) testing. Due to room size and safety concerns, children are not allowed in the ultrasound rooms during exams. Our front office staff cannot provide observation of children in our lobby area while testing is being conducted. An adult accompanying a patient to their appointment will only be allowed in the ultrasound room at the discretion of the ultrasound technician under special circumstances. We apologize for any inconvenience.    Your physician has recommended that you wear a Zio monitor.   This monitor is a medical device that records the heart's electrical activity. Doctors most often use these monitors to diagnose arrhythmias. Arrhythmias  are problems with the speed or rhythm of the heartbeat. The monitor is a small device applied to your chest. You can wear one while you do your normal daily activities. While wearing this monitor if you have any symptoms to push the button and record what you felt. Once you have worn this monitor for the period of time provider prescribed (Usually 14 days), you will return the monitor device in the postage paid box. Once it is returned they will download the data collected and provide us  with a report which the provider will then review and we will call you with those results. Important tips:  Avoid showering during the first 24 hours of wearing the monitor. Avoid excessive sweating to help maximize wear time. Do not submerge the device, no hot tubs, and no swimming pools. Keep any lotions or oils away from the patch. After 24 hours you may shower with the patch on. Take brief showers with your back facing the shower head.  Do not remove patch once it has been placed because that will interrupt data and decrease adhesive wear time. Push the button when you have any symptoms and write down what you were feeling. Once you have completed wearing your monitor, remove and place into box which has postage paid and place in your outgoing mailbox.  If for some reason you have misplaced your box then call our office and we can provide another box and/or mail it off for you.   Follow-Up: At [facility], you and your health needs are our priority.  As part of our continuing mission  to provide you with exceptional heart care, our providers are all part of one team.  This team includes your primary Cardiologist (physician) and Advanced Practice Providers or APPs (Physician Assistants and Nurse Practitioners) who all work together to provide you with the care you need, when you need it.  Your next appointment:   4 month(s)  Provider:   You may see Constancia Delton, MD or one of the following Advanced  Practice Providers on your designated Care Team:   Laneta Pintos, NP Gildardo Labrador, PA-C Varney Gentleman, PA-C Cadence [place], PA-C Ronald Cockayne, NP Morey Ar, NP    We recommend signing up for the patient portal called "MyChart".  Sign up information is provided on this After Visit Summary.  MyChart is used to connect with patients for Virtual Visits (Telemedicine).  Patients are able to view lab/test results, encounter notes, upcoming appointments, etc.  Non-urgent messages can be sent to your provider as well.   To learn more about what you can do with MyChart, go to [email or web address].

## 2023-11-10 NOTE — Progress Notes (Signed)
 Cardiology Office Note:    Date:  11/10/2023   ID:  Caleb Spencer, DOB Nov 01, 1960, MRN [id number]  PCP:  Claire Crick, MD  [facility] Cardiologist:  Constancia Delton, MD  [facility] Electrophysiologist:  None   Referring MD: Claire Crick, MD   Chief Complaint  Patient presents with   Follow-up    Had black out two weeks ago, feels fine , no chest pain,pressure or SOB, medication reviewed verbally with patient     History of Present Illness:    Caleb Spencer is a 63 y.o. male with a hx of CAD (mild prox LAD disease, calcium  score 510 on CCTA 02/2021),  hypertension, hyperlipidemia who presents for follow-up.   States having an episode of syncope 2 weeks ago.  He was walking to his kitchen and then suddenly passed out.  Denies any prodromal symptoms of dizziness, flushness.  Denies chest pain, palpitations, shortness of breath or edema.  He woke up, had some soreness in his head, did not follow-up in the ED.  Held some ice around his head for couple of hours.  Has felt well since.  Denies any prior episodes.  States being compliant with medications including Zetia , Aldactone  as prescribed.   Prior notes Coronary CTA 01/2021 mild LAD disease, calcium  score 510, FFR CT no significant stenosis Echo 10/2020 EF 45 to 50% mild to moderate AI Echocardiogram on 02/14/2020 showed normal systolic and diastolic function, EF 60%, mild AI.  No LVH noted on echocardiogram.  Past Medical History:  Diagnosis Date   BPH (benign prostatic hyperplasia)    Complication of anesthesia    a.) delayed emergence   Coronary artery disease    a.) cCTA 01/22/2021: Ca score 510 (92nd percentile age/sex match control) --> 1-24% p-mRCA, 25-49% pLAD, 1-24% pLCx --> FFR analysis normal   GERD (gastroesophageal reflux disease)    HFrEF (heart failure with reduced ejection fraction) (HCC)    a.) TTE 10/23/2020: EF 45-50%, glob HK, mild MR, mild-mod AR, mild AoV sclerosis with no stenosis, G2DD, GLS  -11.1%   History of kidney stones    History of smoking    Hyperlipidemia    Hypertension    Left groin hernia    Occasional use of marijuana    Tuberculosis     Past Surgical History:  Procedure Laterality Date   INGUINAL HERNIA REPAIR Left 07/18/1985   VENTRAL HERNIA REPAIR N/A 05/12/2022   Procedure: HERNIA REPAIR VENTRAL ADULT, open;  Surgeon: Emmalene Hare, MD;  Location: [facility] ORS;  Service: General;  Laterality: N/A;    Current Medications: Current Meds  Medication Sig   acetaminophen  (TYLENOL ) 500 MG tablet Take 2 tablets (1,000 mg total) by mouth every 6 (six) hours as needed for mild pain.   aspirin  EC 81 MG tablet Take 1 tablet (81 mg total) by mouth daily. Swallow whole.   Bempedoic Acid  180 MG TABS Take 1 tablet (180 mg total) by mouth daily.   carvedilol  (COREG ) 6.25 MG tablet Take 1 tablet (6.25 mg total) by mouth 2 (two) times daily.   ezetimibe  (ZETIA ) 10 MG tablet Take 1 tablet (10 mg total) by mouth daily.   ibuprofen  (ADVIL ) 600 MG tablet Take 1 tablet (600 mg total) by mouth every 8 (eight) hours as needed for moderate pain.   Multiple Vitamin (MULTIVITAMIN PO) Take 1 tablet by mouth daily.   sacubitril -valsartan  (ENTRESTO ) 49-51 MG Take 1 tablet by mouth 2 (two) times daily.     Allergies:   Imdur  [  isosorbide  nitrate], Crestor  [rosuvastatin ], and Pravastatin    Social History   Socioeconomic History   Marital status: Single    Spouse name: Not on file   Number of children: Not on file   Years of education: 10th   Highest education level: Not on file  Occupational History   Occupation: fireproofing and Systems analyst: [employer]  Tobacco Use   Smoking status: Former    Current packs/day: 0.00    Types: Cigarettes    Quit date: 2021    Years since quitting: 4.3   Smokeless tobacco: Never  Vaping Use   Vaping status: Never Used  Substance and Sexual Activity   Alcohol use: Yes    Comment: 2 beers nightly   Drug use: Yes    Types:  Marijuana    Comment: occ   Sexual activity: Not on file  Other Topics Concern   Not on file  Social History Narrative   Lives alone, no pets   Occ: Southern Engineer, production   Activity: no regular exercise    Diet: good water, increasing fruits/vegetables    Social Drivers of Corporate investment banker Strain: Not on file  Food Insecurity: Not on file  Transportation Needs: Not on file  Physical Activity: Not on file  Stress: Not on file  Social Connections: Not on file     Family History: The patient's family history includes Alcohol abuse in his father; Coronary artery disease in his father; Diabetes in his mother; Lung cancer in his maternal aunt. There is no history of Stroke.  ROS:   Please see the history of present illness.     All other systems reviewed and are negative.  EKGs/Labs/Other Studies Reviewed:    The following studies were reviewed today:   EKG:  EKG is  ordered today.  The ekg ordered today demonstrates sinus rhythm, heart rate 63  Recent Labs: 07/14/2023: BUN 16; Creatinine, Ser 1.01; Potassium 4.0; Sodium 143  Recent Lipid Panel    Component Value Date/Time   CHOL 250 (H) 07/14/2023 0928   TRIG 184 (H) 07/14/2023 0928   HDL 67 07/14/2023 0928   CHOLHDL 3.7 07/14/2023 0928   CHOLHDL 4 01/14/2022 0940   VLDL 20.0 01/14/2022 0940   LDLCALC 150 (H) 07/14/2023 0928   LDLDIRECT 87.0 05/15/2020 0756    Physical Exam:    VS:  BP 118/62 (BP Location: Left Arm, Patient Position: Sitting, Cuff Size: Normal)   Pulse (!) 59   Ht 6' (1.829 m)   Wt 198 lb 3.2 oz (89.9 kg)   SpO2 99%   BMI 26.88 kg/m     Wt Readings from Last 3 Encounters:  11/10/23 198 lb 3.2 oz (89.9 kg)  06/30/23 206 lb 6.4 oz (93.6 kg)  01/27/23 194 lb 3.2 oz (88.1 kg)     GEN:  Well nourished, well developed in no acute distress HEENT: Normal NECK: No JVD; No carotid bruits CARDIAC: RRR, no murmurs, rubs, gallops RESPIRATORY:  Clear to auscultation without  rales, wheezing or rhonchi  ABDOMEN: Soft, non-tender, non-distended MUSCULOSKELETAL:  No edema; No deformity  SKIN: Warm and dry NEUROLOGIC:  Alert and oriented x 3 PSYCHIATRIC:  Normal affect   ASSESSMENT:    1. Syncope and collapse   2. Coronary artery disease involving native coronary artery of native heart, unspecified whether angina present   3. Decreased cardiac ejection fraction   4. Primary hypertension   5. Pure hypercholesterolemia    PLAN:  In order of problems listed above:  Syncope, etiology unclear.  Place cardiac monitor x 2 weeks to evaluate any significant arrhythmias.  Fortunately, has not had any further episodes.  Obtain limited echo in 3 months. CAD, nonobstructive, mild calcified proximal LAD disease.  Denies chest pain.  Continue Coreg  6.25 twice daily, aspirin , Zetia . Mildly reduced EF of 45%.  Limited echo 8/24 EF 45 to 50%.  Continue spironolactone  25 mg daily, Coreg  6.25 mg twice daily, Entresto  49-51 mg twice daily.  Limited echo in 3 months as above. Hypertension, BP controlled.  Continue spironolactone  25 mg daily,Coreg , Entresto  as above Hyperlipidemia, myalgias with statins.  Continue Zetia , and fasting lipid profile.  Follow-up in 4 months  Medication Adjustments/Labs and Tests Ordered: Current medicines are reviewed at length with the patient today.  Concerns regarding medicines are outlined above.  Orders Placed This Encounter  Procedures   Lipid panel   LONG TERM MONITOR (3-14 DAYS)   EKG 12-Lead   ECHOCARDIOGRAM LIMITED   No orders of the defined types were placed in this encounter.   Patient Instructions  Medication Instructions:  Your Physician recommend you continue on your current medication as directed.     *If you need a refill on your cardiac medications before your next appointment, please call your pharmacy*  Lab Work: Your provider would like for you to return in 3 months to have the following labs drawn: lipid panel.   [facility] go to [facility] [address] ([facility] [address]) [address], [place] [address] You do not need an appointment.  They are open from 8 am- 4:30 pm.  Lunch from 1:00 pm- 2:00 pm You DO need to be fasting.   Testing/Procedures: Your physician has requested that you have an echocardiogram. Echocardiography is a painless test that uses sound waves to create images of your heart. It provides your doctor with information about the size and shape of your heart and how well your heart's chambers and valves are working.   You may receive an ultrasound enhancing agent through an IV if needed to better visualize your heart during the echo. This procedure takes approximately one hour.  There are no restrictions for this procedure.  This will take place at [address] [facility]) [address], [place] [address]  Please note: We ask at that you not bring children with you during ultrasound (echo/ vascular) testing. Due to room size and safety concerns, children are not allowed in the ultrasound rooms during exams. Our front office staff cannot provide observation of children in our lobby area while testing is being conducted. An adult accompanying a patient to their appointment will only be allowed in the ultrasound room at the discretion of the ultrasound technician under special circumstances. We apologize for any inconvenience.    Your physician has recommended that you wear a Zio monitor.   This monitor is a medical device that records the heart's electrical activity. Doctors most often use these monitors to diagnose arrhythmias. Arrhythmias are problems with the speed or rhythm of the heartbeat. The monitor is a small device applied to your chest. You can wear one while you do your normal daily activities. While wearing this monitor if you have any symptoms to push the button and record what you felt. Once you have worn this monitor for the period of time provider  prescribed (Usually 14 days), you will return the monitor device in the postage paid box. Once it is returned they will download the data  collected and provide us  with a report which the provider will then review and we will call you with those results. Important tips:  Avoid showering during the first 24 hours of wearing the monitor. Avoid excessive sweating to help maximize wear time. Do not submerge the device, no hot tubs, and no swimming pools. Keep any lotions or oils away from the patch. After 24 hours you may shower with the patch on. Take brief showers with your back facing the shower head.  Do not remove patch once it has been placed because that will interrupt data and decrease adhesive wear time. Push the button when you have any symptoms and write down what you were feeling. Once you have completed wearing your monitor, remove and place into box which has postage paid and place in your outgoing mailbox.  If for some reason you have misplaced your box then call our office and we can provide another box and/or mail it off for you.   Follow-Up: At [facility], you and your health needs are our priority.  As part of our continuing mission to provide you with exceptional heart care, our providers are all part of one team.  This team includes your primary Cardiologist (physician) and Advanced Practice Providers or APPs (Physician Assistants and Nurse Practitioners) who all work together to provide you with the care you need, when you need it.  Your next appointment:   4 month(s)  Provider:   You may see Constancia Delton, MD or one of the following Advanced Practice Providers on your designated Care Team:   Laneta Pintos, NP Gildardo Labrador, PA-C Varney Gentleman, PA-C Cadence [place], PA-C Ronald Cockayne, NP Morey Ar, NP    We recommend signing up for the patient portal called "MyChart".  Sign up information is provided on this After Visit Summary.  MyChart is used to  connect with patients for Virtual Visits (Telemedicine).  Patients are able to view lab/test results, encounter notes, upcoming appointments, etc.  Non-urgent messages can be sent to your provider as well.   To learn more about what you can do with MyChart, go to [email or web address].         Signed, Constancia Delton, MD  11/10/2023 11:12 AM    [facility]

## 2023-11-30 DIAGNOSIS — R55 Syncope and collapse: Secondary | ICD-10-CM | POA: Diagnosis not present

## 2023-12-04 ENCOUNTER — Other Ambulatory Visit: Payer: Self-pay

## 2023-12-04 MED ORDER — CARVEDILOL 6.25 MG PO TABS: 6.2500 mg | ORAL_TABLET | Freq: Two times a day (BID) | ORAL | 1 refills | Status: DC

## 2023-12-06 ENCOUNTER — Ambulatory Visit: Payer: Self-pay | Admitting: Cardiology

## 2023-12-06 DIAGNOSIS — R55 Syncope and collapse: Secondary | ICD-10-CM | POA: Diagnosis not present

## 2024-01-05 ENCOUNTER — Other Ambulatory Visit: Payer: Self-pay | Admitting: Cardiology

## 2024-02-12 ENCOUNTER — Ambulatory Visit: Attending: Cardiology

## 2024-02-12 DIAGNOSIS — R55 Syncope and collapse: Secondary | ICD-10-CM

## 2024-02-12 LAB — ECHOCARDIOGRAM LIMITED
AV Mean grad: 3 mmHg
AV Peak grad: 5.8 mmHg
Ao pk vel: 1.2 m/s
Area-P 1/2: 3.12 cm2
S' Lateral: 4.64 cm

## 2024-02-14 ENCOUNTER — Telehealth: Payer: Self-pay | Admitting: Cardiology

## 2024-02-14 NOTE — Telephone Encounter (Signed)
 The patient has been notified of the result along with recommendations. Pt verbalized understanding. All questions (if any) were answered     Redell Cave, MD 02/12/2024  1:41 PM EDT     Echo shows normal systolic function.  No findings to suggest etiology of syncope.

## 2024-02-14 NOTE — Telephone Encounter (Signed)
 Follow Up:      Patient is calling back for his Echo results. He says he does not have My Chart. m

## 2024-03-22 ENCOUNTER — Ambulatory Visit: Attending: Cardiology | Admitting: Cardiology

## 2024-03-22 VITALS — BP 124/68 | HR 72 | Ht 71.0 in | Wt 202.2 lb

## 2024-03-22 DIAGNOSIS — I2510 Atherosclerotic heart disease of native coronary artery without angina pectoris: Secondary | ICD-10-CM

## 2024-03-22 DIAGNOSIS — I10 Essential (primary) hypertension: Secondary | ICD-10-CM | POA: Diagnosis not present

## 2024-03-22 DIAGNOSIS — R55 Syncope and collapse: Secondary | ICD-10-CM | POA: Diagnosis not present

## 2024-03-22 DIAGNOSIS — R931 Abnormal findings on diagnostic imaging of heart and coronary circulation: Secondary | ICD-10-CM

## 2024-03-22 DIAGNOSIS — E7800 Pure hypercholesterolemia, unspecified: Secondary | ICD-10-CM

## 2024-03-22 NOTE — Progress Notes (Signed)
 Cardiology Office Note:    Date:  03/22/2024   ID:  Caleb Spencer, DOB [birth year], MRN [id number]  PCP:  Rilla Baller, MD  [facility] Cardiologist:  Redell Cave, MD  [facility] Electrophysiologist:  None   Referring MD: Rilla Baller, MD   Chief Complaint  Patient presents with   Follow-up    4 month follow up pt has been doing well with no complaints of chest pain, chest pressure or SOB, medciation reviewed verbally with patient    History of Present Illness:    Caleb Spencer is a 63 y.o. male with a hx of CAD (mild prox LAD disease, calcium  score 510 on CCTA 02/2021),  hypertension, hyperlipidemia who presents for follow-up.   Previously seen due to symptoms of syncope, cardiac monitor placed to evaluate any significant arrhythmias.  Previous echo showed mild reduction in EF, repeat limited echo obtained.  He states feeling well, denies any other episodes of dizziness, presyncope or syncope.  Compliant with Zetia  and bempedoic acid  as prescribed.   Prior notes Coronary CTA 01/2021 mild LAD disease, calcium  score 510, FFR CT no significant stenosis Echo 10/2020 EF 45 to 50% mild to moderate AI Echocardiogram on 02/14/2020 showed normal systolic and diastolic function, EF 60%, mild AI.  No LVH noted on echocardiogram.  Past Medical History:  Diagnosis Date   BPH (benign prostatic hyperplasia)    Complication of anesthesia    a.) delayed emergence   Coronary artery disease    a.) cCTA 01/22/2021: Ca score 510 (92nd percentile age/sex match control) --> 1-24% p-mRCA, 25-49% pLAD, 1-24% pLCx --> FFR analysis normal   GERD (gastroesophageal reflux disease)    HFrEF (heart failure with reduced ejection fraction) (HCC)    a.) TTE 10/23/2020: EF 45-50%, glob HK, mild MR, mild-mod AR, mild AoV sclerosis with no stenosis, G2DD, GLS -11.1%   History of kidney stones    History of smoking    Hyperlipidemia    Hypertension    Left groin hernia    Occasional use of  marijuana    Tuberculosis     Past Surgical History:  Procedure Laterality Date   INGUINAL HERNIA REPAIR Left 07/18/1985   VENTRAL HERNIA REPAIR N/A 05/12/2022   Procedure: HERNIA REPAIR VENTRAL ADULT, open;  Surgeon: Desiderio Schanz, MD;  Location: [facility] ORS;  Service: General;  Laterality: N/A;    Current Medications: Current Meds  Medication Sig   acetaminophen  (TYLENOL ) 500 MG tablet Take 2 tablets (1,000 mg total) by mouth every 6 (six) hours as needed for mild pain.   aspirin  EC 81 MG tablet Take 1 tablet (81 mg total) by mouth daily. Swallow whole.   Bempedoic Acid  180 MG TABS Take 1 tablet (180 mg total) by mouth daily.   carvedilol  (COREG ) 6.25 MG tablet Take 1 tablet (6.25 mg total) by mouth 2 (two) times daily.   ezetimibe  (ZETIA ) 10 MG tablet Take 1 tablet (10 mg total) by mouth daily.   ibuprofen  (ADVIL ) 600 MG tablet Take 1 tablet (600 mg total) by mouth every 8 (eight) hours as needed for moderate pain.   Multiple Vitamin (MULTIVITAMIN PO) Take 1 tablet by mouth daily.   sacubitril -valsartan  (ENTRESTO ) 49-51 MG TAKE 1 TABLET BY MOUTH TWICE A DAY   spironolactone  (ALDACTONE ) 25 MG tablet Take 1 tablet (25 mg total) by mouth daily.     Allergies:   Imdur  [isosorbide  nitrate], Crestor  [rosuvastatin ], and Pravastatin    Social History   Socioeconomic History   Marital status:  Single    Spouse name: Not on file   Number of children: Not on file   Years of education: 10th   Highest education level: Not on file  Occupational History   Occupation: fireproofing and Systems analyst: [employer]  Tobacco Use   Smoking status: Former    Current packs/day: 0.00    Types: Cigarettes    Quit date: 2021    Years since quitting: 4.6   Smokeless tobacco: Never  Vaping Use   Vaping status: Never Used  Substance and Sexual Activity   Alcohol use: Yes    Comment: 2 beers nightly   Drug use: Yes    Types: Marijuana    Comment: occ   Sexual activity: Not on file   Other Topics Concern   Not on file  Social History Narrative   Lives alone, no pets   Occ: Southern Engineer, production   Activity: no regular exercise    Diet: good water, increasing fruits/vegetables    Social Drivers of Corporate investment banker Strain: Not on file  Food Insecurity: Not on file  Transportation Needs: Not on file  Physical Activity: Not on file  Stress: Not on file  Social Connections: Not on file     Family History: The patient's family history includes Alcohol abuse in his father; Coronary artery disease in his father; Diabetes in his mother; Lung cancer in his maternal aunt. There is no history of Stroke.  ROS:   Please see the history of present illness.     All other systems reviewed and are negative.  EKGs/Labs/Other Studies Reviewed:    The following studies were reviewed today:    Recent Labs: 07/14/2023: BUN 16; Creatinine, Ser 1.01; Potassium 4.0; Sodium 143  Recent Lipid Panel    Component Value Date/Time   CHOL 250 (H) 07/14/2023 0928   TRIG 184 (H) 07/14/2023 0928   HDL 67 07/14/2023 0928   CHOLHDL 3.7 07/14/2023 0928   CHOLHDL 4 01/14/2022 0940   VLDL 20.0 01/14/2022 0940   LDLCALC 150 (H) 07/14/2023 0928   LDLDIRECT 87.0 05/15/2020 0756    Physical Exam:    VS:  BP 124/68 (BP Location: Left Arm, Patient Position: Sitting, Cuff Size: Normal)   Pulse 72   Ht 5' 11 (1.803 m)   Wt 202 lb 3.2 oz (91.7 kg)   SpO2 98%   BMI 28.20 kg/m     Wt Readings from Last 3 Encounters:  03/22/24 202 lb 3.2 oz (91.7 kg)  11/10/23 198 lb 3.2 oz (89.9 kg)  06/30/23 206 lb 6.4 oz (93.6 kg)     GEN:  Well nourished, well developed in no acute distress HEENT: Normal NECK: No JVD; No carotid bruits CARDIAC: RRR, no murmurs, rubs, gallops RESPIRATORY:  Clear to auscultation without rales, wheezing or rhonchi  ABDOMEN: Soft, non-tender, non-distended MUSCULOSKELETAL:  No edema; No deformity  SKIN: Warm and dry NEUROLOGIC:  Alert and  oriented x 3 PSYCHIATRIC:  Normal affect   ASSESSMENT:    1. Syncope and collapse   2. Coronary artery disease involving native coronary artery of native heart, unspecified whether angina present   3. Decreased cardiac ejection fraction   4. Primary hypertension   5. Pure hypercholesterolemia     PLAN:    In order of problems listed above:  Syncope, etiology unclear.  Cardiac monitor showed no significant arrhythmia, echo 7/25 with normal EF 50 to 55%.  Fortunately, has not had any further  episodes.   CAD, nonobstructive, mild calcified proximal LAD disease.  Denies chest pain.  Continue Coreg  6.25 twice daily, aspirin , Zetia , bempedoic acid .. Mildly reduced EF of 45%.  Limited echo 7/25 EF 50-55%.  Continue spironolactone  25 mg daily, Coreg  6.25 mg twice daily, Entresto  49-51 mg twice daily.  Hypertension, BP controlled.  Continue spironolactone  25 mg daily,Coreg , Entresto  as above Hyperlipidemia, myalgias with statins.  Continue Zetia  10 mg daily, bempedoic acid  180 mg daily.  Repeat lipid panel in about 6 months.  Follow-up in 12 months  Medication Adjustments/Labs and Tests Ordered: Current medicines are reviewed at length with the patient today.  Concerns regarding medicines are outlined above.  Orders Placed This Encounter  Procedures   Lipid panel   No orders of the defined types were placed in this encounter.   Patient Instructions  Medication Instructions:  Your physician recommends that you continue on your current medications as directed. Please refer to the Current Medication list given to you today.   *If you need a refill on your cardiac medications before your next appointment, please call your pharmacy*  Lab Work: Your provider would like for you to return in 6 months to have the following labs drawn: lipid panel.   Please go to [facility] [address] ([facility]) [address], [place] [address] You do not need an appointment.  They are  open from 8 am- 4:30 pm.  Lunch from 1:00 pm- 2:00 pm You do need to be fasting.  If you have labs (blood work) drawn today and your tests are completely normal, you will receive your results only by: MyChart Message (if you have MyChart) OR A paper copy in the mail If you have any lab test that is abnormal or we need to change your treatment, we will call you to review the results.  Testing/Procedures: No test ordered today   Follow-Up: At [facility], you and your health needs are our priority.  As part of our continuing mission to provide you with exceptional heart care, our providers are all part of one team.  This team includes your primary Cardiologist (physician) and Advanced Practice Providers or APPs (Physician Assistants and Nurse Practitioners) who all work together to provide you with the care you need, when you need it.  Your next appointment:   1 year(s)  Provider:   You may see Redell Cave, MD or one of the following Advanced Practice Providers on your designated Care Team:   Lonni Meager, NP Lesley Maffucci, PA-C Bernardino Bring, PA-C Cadence [place], PA-C Tylene Lunch, NP Barnie Hila, NP    We recommend signing up for the patient portal called MyChart.  Sign up information is provided on this After Visit Summary.  MyChart is used to connect with patients for Virtual Visits (Telemedicine).  Patients are able to view lab/test results, encounter notes, upcoming appointments, etc.  Non-urgent messages can be sent to your provider as well.   To learn more about what you can do with MyChart, go to [email or web address].          Signed, Redell Cave, MD  03/22/2024 9:40 AM    [facility]

## 2024-03-22 NOTE — Patient Instructions (Signed)
 Medication Instructions:  Your physician recommends that you continue on your current medications as directed. Please refer to the Current Medication list given to you today.   *If you need a refill on your cardiac medications before your next appointment, please call your pharmacy*  Lab Work: Your provider would like for you to return in 6 months to have the following labs drawn: lipid panel.   Please go to [facility] [address] ([facility]) [address], [place] [address] You do not need an appointment.  They are open from 8 am- 4:30 pm.  Lunch from 1:00 pm- 2:00 pm You do need to be fasting.  If you have labs (blood work) drawn today and your tests are completely normal, you will receive your results only by: MyChart Message (if you have MyChart) OR A paper copy in the mail If you have any lab test that is abnormal or we need to change your treatment, we will call you to review the results.  Testing/Procedures: No test ordered today   Follow-Up: At [facility], you and your health needs are our priority.  As part of our continuing mission to provide you with exceptional heart care, our providers are all part of one team.  This team includes your primary Cardiologist (physician) and Advanced Practice Providers or APPs (Physician Assistants and Nurse Practitioners) who all work together to provide you with the care you need, when you need it.  Your next appointment:   1 year(s)  Provider:   You may see Redell Cave, MD or one of the following Advanced Practice Providers on your designated Care Team:   Lonni Meager, NP Lesley Maffucci, PA-C Bernardino Bring, PA-C Cadence [place], PA-C Tylene Lunch, NP Barnie Hila, NP    We recommend signing up for the patient portal called MyChart.  Sign up information is provided on this After Visit Summary.  MyChart is used to connect with patients for Virtual Visits (Telemedicine).  Patients are able to  view lab/test results, encounter notes, upcoming appointments, etc.  Non-urgent messages can be sent to your provider as well.   To learn more about what you can do with MyChart, go to [email or web address].

## 2024-06-13 ENCOUNTER — Other Ambulatory Visit: Payer: Self-pay | Admitting: Cardiology

## 2024-06-16 ENCOUNTER — Other Ambulatory Visit: Payer: Self-pay | Admitting: Cardiology

## 2024-06-16 DIAGNOSIS — I2510 Atherosclerotic heart disease of native coronary artery without angina pectoris: Secondary | ICD-10-CM

## 2024-06-16 DIAGNOSIS — I10 Essential (primary) hypertension: Secondary | ICD-10-CM
